# Patient Record
Sex: Male | Born: 1945
Health system: Southern US, Community
[De-identification: ages and names within clinical notes are randomized; demographics above are authoritative.]

## PROBLEM LIST (undated history)

## (undated) DIAGNOSIS — C50919 Malignant neoplasm of unspecified site of unspecified female breast: Secondary | ICD-10-CM

## (undated) DIAGNOSIS — Z803 Family history of malignant neoplasm of breast: Secondary | ICD-10-CM

## (undated) DIAGNOSIS — Z923 Personal history of irradiation: Secondary | ICD-10-CM

## (undated) HISTORY — DX: Malignant neoplasm of unspecified site of unspecified female breast: C50.919

## (undated) HISTORY — DX: Family history of malignant neoplasm of breast: Z80.3

## (undated) HISTORY — PX: ADENOIDECTOMY: SUR15

## (undated) HISTORY — PX: MASTECTOMY: SHX3

---
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
## 2011-02-22 ENCOUNTER — Telehealth: Payer: Self-pay

## 2011-02-24 NOTE — Telephone Encounter (Signed)
Pt came in and scheduled appt

## 2011-03-28 ENCOUNTER — Other Ambulatory Visit: Payer: Self-pay | Admitting: Internal Medicine

## 2011-03-28 ENCOUNTER — Encounter: Payer: Self-pay | Admitting: Internal Medicine

## 2011-03-28 ENCOUNTER — Ambulatory Visit (INDEPENDENT_AMBULATORY_CARE_PROVIDER_SITE_OTHER): Payer: BC Managed Care – PPO | Admitting: Internal Medicine

## 2011-03-28 ENCOUNTER — Other Ambulatory Visit (INDEPENDENT_AMBULATORY_CARE_PROVIDER_SITE_OTHER): Payer: BC Managed Care – PPO

## 2011-03-28 VITALS — BP 122/80 | HR 84 | Temp 98.4°F | Resp 16 | Ht 66.0 in | Wt 170.0 lb

## 2011-03-28 DIAGNOSIS — Z0000 Encounter for general adult medical examination without abnormal findings: Secondary | ICD-10-CM | POA: Insufficient documentation

## 2011-03-28 LAB — URINALYSIS
Hgb urine dipstick: NEGATIVE
Ketones, ur: NEGATIVE
Specific Gravity, Urine: >=1.03 (ref 1.000–1.030)
Urine Glucose: NEGATIVE
Urobilinogen, UA: 0.2 (ref 0.0–1.0)

## 2011-03-28 LAB — CBC WITH DIFFERENTIAL/PLATELET
Basophils Relative: 0.2 % (ref 0.0–3.0)
Eosinophils Absolute: 0.1 10*3/uL (ref 0.0–0.7)
Hemoglobin: 15.6 g/dL (ref 13.0–17.0)
MCHC: 33.8 g/dL (ref 30.0–36.0)
MCV: 83 fl (ref 78.0–100.0)
Monocytes Absolute: 0.8 10*3/uL (ref 0.1–1.0)
Neutro Abs: 5.1 10*3/uL (ref 1.4–7.7)
RBC: 5.56 Mil/uL (ref 4.22–5.81)

## 2011-03-28 LAB — HEPATIC FUNCTION PANEL
AST: 21 U/L (ref 0–37)
Albumin: 3.9 g/dL (ref 3.5–5.2)
Alkaline Phosphatase: 70 U/L (ref 39–117)
Total Protein: 7.1 g/dL (ref 6.0–8.3)

## 2011-03-28 LAB — LIPID PANEL: Cholesterol: 235 mg/dL — ABNORMAL HIGH (ref 0–200)

## 2011-03-28 LAB — BASIC METABOLIC PANEL
BUN: 17 mg/dL (ref 6–23)
Creatinine, Ser: 0.7 mg/dL (ref 0.4–1.5)
GFR: 118.83 mL/min (ref >60.00)

## 2011-03-28 NOTE — Progress Notes (Signed)
  Subjective:    Patient ID: Danny Gutierrez, male    DOB: 26-Jun-1945, 65 y.o.   MRN: [id number]  HPI New pt The patient is here for a wellness exam. The patient has been doing well overall without major physical or psychological issues going on lately. C/o a spot on scalp  Review of Systems  Constitutional: Negative for activity change, appetite change, fatigue and unexpected weight change.  HENT: Negative for nosebleeds, congestion, sore throat, sneezing, trouble swallowing and neck pain.   Eyes: Negative for itching and visual disturbance.  Respiratory: Negative for cough.   Cardiovascular: Negative for chest pain, palpitations and leg swelling.  Gastrointestinal: Negative for nausea, diarrhea, blood in stool and abdominal distention.  Genitourinary: Negative for frequency and hematuria.  Musculoskeletal: Negative for back pain, joint swelling and gait problem.  Skin: Negative for rash.  Neurological: Negative for dizziness, tremors, speech difficulty and weakness.  Psychiatric/Behavioral: Negative for sleep disturbance, dysphoric mood and agitation. The patient is not nervous/anxious.        Objective:   Physical Exam  Constitutional: He is oriented to person, place, and time. He appears well-developed.       Obese  HENT:  Mouth/Throat: Oropharynx is clear and moist.  Eyes: Conjunctivae are normal. Pupils are equal, round, and reactive to light.  Neck: Normal range of motion. No JVD present. No thyromegaly present.  Cardiovascular: Normal rate, regular rhythm, normal heart sounds and intact distal pulses.  Exam reveals no gallop and no friction rub.   No murmur heard. Pulmonary/Chest: Effort normal and breath sounds normal. No respiratory distress. He has no wheezes. He has no rales. He exhibits no tenderness.  Abdominal: Soft. Bowel sounds are normal. He exhibits no distension and no mass. There is no tenderness. There is no rebound and no guarding.  Musculoskeletal:  Normal range of motion. He exhibits no edema and no tenderness.  Lymphadenopathy:    He has no cervical adenopathy.  Neurological: He is alert and oriented to person, place, and time. He has normal reflexes. No cranial nerve deficit. He exhibits normal muscle tone. Coordination normal.  Skin: Skin is warm and dry. No rash noted.  Psychiatric: He has a normal mood and affect. His behavior is normal. Judgment and thought content normal.          Assessment & Plan:

## 2011-03-29 LAB — LDL CHOLESTEROL, DIRECT: Direct LDL: 160.8 mg/dL

## 2011-03-30 ENCOUNTER — Encounter: Payer: Self-pay | Admitting: Internal Medicine

## 2011-03-30 ENCOUNTER — Telehealth: Payer: Self-pay | Admitting: Internal Medicine

## 2011-03-30 NOTE — Assessment & Plan Note (Signed)
We discussed age appropriate health related issues, including available/recomended screening tests and vaccinations. We discussed a need for adhering to healthy diet and exercise. Labs/EKG were reviewed/ordered. All questions were answered.  Declined all shots  

## 2011-03-30 NOTE — Telephone Encounter (Signed)
Stacey, please, inform patient that all labs are normal   Please, mail the labs to the patient.    Thx  

## 2011-03-31 NOTE — Telephone Encounter (Signed)
Left detailed mess informing pt of below. Copies mailed.  

## 2011-06-08 ENCOUNTER — Encounter: Payer: Self-pay | Admitting: Gastroenterology

## 2012-01-24 ENCOUNTER — Ambulatory Visit (INDEPENDENT_AMBULATORY_CARE_PROVIDER_SITE_OTHER): Payer: BC Managed Care – PPO | Admitting: Internal Medicine

## 2012-01-24 ENCOUNTER — Encounter: Payer: Self-pay | Admitting: Internal Medicine

## 2012-01-24 ENCOUNTER — Other Ambulatory Visit (INDEPENDENT_AMBULATORY_CARE_PROVIDER_SITE_OTHER): Payer: BC Managed Care – PPO

## 2012-01-24 VITALS — BP 132/80 | HR 80 | Temp 98.7°F | Resp 16 | Wt 163.0 lb

## 2012-01-24 DIAGNOSIS — L723 Sebaceous cyst: Secondary | ICD-10-CM

## 2012-01-24 DIAGNOSIS — Z0000 Encounter for general adult medical examination without abnormal findings: Secondary | ICD-10-CM

## 2012-01-24 LAB — CBC WITH DIFFERENTIAL/PLATELET
Basophils Absolute: 0 10*3/uL (ref 0.0–0.1)
Basophils Relative: 0.3 % (ref 0.0–3.0)
Eosinophils Absolute: 0.1 10*3/uL (ref 0.0–0.7)
Lymphocytes Relative: 21.7 % (ref 12.0–46.0)
MCHC: 33.4 g/dL (ref 30.0–36.0)
MCV: 83.9 fl (ref 78.0–100.0)
Monocytes Absolute: 0.5 10*3/uL (ref 0.1–1.0)
Neutrophils Relative %: 69.8 % (ref 43.0–77.0)
RDW: 14.9 % — ABNORMAL HIGH (ref 11.5–14.6)

## 2012-01-24 LAB — LIPID PANEL
Cholesterol: 228 mg/dL — ABNORMAL HIGH (ref 0–200)
HDL: 52.8 mg/dL (ref >39.00)
Triglycerides: 48 mg/dL (ref 0.0–149.0)
VLDL: 9.6 mg/dL (ref 0.0–40.0)

## 2012-01-24 LAB — LDL CHOLESTEROL, DIRECT: Direct LDL: 157.9 mg/dL

## 2012-01-24 LAB — BASIC METABOLIC PANEL
CO2: 25 mEq/L (ref 19–32)
Calcium: 9.3 mg/dL (ref 8.4–10.5)
Chloride: 102 mEq/L (ref 96–112)
Potassium: 4.5 mEq/L (ref 3.5–5.1)
Sodium: 137 mEq/L (ref 135–145)

## 2012-01-24 LAB — URINALYSIS, ROUTINE W REFLEX MICROSCOPIC
Bilirubin Urine: NEGATIVE
Nitrite: NEGATIVE
Total Protein, Urine: NEGATIVE
pH: 5 (ref 5.0–8.0)

## 2012-01-24 LAB — PSA: PSA: 2.04 ng/mL (ref 0.10–4.00)

## 2012-01-24 LAB — HEPATIC FUNCTION PANEL
AST: 19 U/L (ref 0–37)
Alkaline Phosphatase: 57 U/L (ref 39–117)
Bilirubin, Direct: 0.1 mg/dL (ref 0.0–0.3)
Total Bilirubin: 0.8 mg/dL (ref 0.3–1.2)

## 2012-01-24 NOTE — Assessment & Plan Note (Signed)
We discussed age appropriate health related issues, including available/recomended screening tests and vaccinations. We discussed a need for adhering to healthy diet and exercise. Labs/EKG were reviewed/ordered. All questions were answered.  Declined all shots Colonoscopy was adviced

## 2012-01-24 NOTE — Assessment & Plan Note (Signed)
2013 scalp - confirmed by [place] He declined an option of removal

## 2012-01-24 NOTE — Progress Notes (Signed)
   Subjective:    Patient ID: Danny Gutierrez, male    DOB: 10/01/1945, 66 y.o.   MRN: [id number]  HPI  The patient is here for a wellness exam. The patient has been doing well overall without major physical or psychological issues going on lately. C/o a growth on scalp  Review of Systems  Constitutional: Negative for activity change, appetite change, fatigue and unexpected weight change.  HENT: Negative for nosebleeds, congestion, sore throat, sneezing, trouble swallowing and neck pain.   Eyes: Negative for itching and visual disturbance.  Respiratory: Negative for cough.   Cardiovascular: Negative for chest pain, palpitations and leg swelling.  Gastrointestinal: Negative for nausea, diarrhea, blood in stool and abdominal distention.  Genitourinary: Negative for frequency and hematuria.  Musculoskeletal: Negative for back pain, joint swelling and gait problem.  Skin: Negative for rash.  Neurological: Negative for dizziness, tremors, speech difficulty and weakness.  Psychiatric/Behavioral: Negative for disturbed wake/sleep cycle, dysphoric mood and agitation. The patient is not nervous/anxious.        Objective:   Physical Exam  Constitutional: He is oriented to person, place, and time. He appears well-developed.       Obese  HENT:  Mouth/Throat: Oropharynx is clear and moist.  Eyes: Conjunctivae are normal. Pupils are equal, round, and reactive to light.  Neck: Normal range of motion. No JVD present. No thyromegaly present.  Cardiovascular: Normal rate, regular rhythm, normal heart sounds and intact distal pulses.  Exam reveals no gallop and no friction rub.   No murmur heard. Pulmonary/Chest: Effort normal and breath sounds normal. No respiratory distress. He has no wheezes. He has no rales. He exhibits no tenderness.  Abdominal: Soft. Bowel sounds are normal. He exhibits no distension and no mass. There is no tenderness. There is no rebound and no guarding.    Musculoskeletal: Normal range of motion. He exhibits no edema and no tenderness.  Lymphadenopathy:    He has no cervical adenopathy.  Neurological: He is alert and oriented to person, place, and time. He has normal reflexes. No cranial nerve deficit. He exhibits normal muscle tone. Coordination normal.  Skin: Skin is warm and dry. No rash noted.  Psychiatric: He has a normal mood and affect. His behavior is normal. Judgment and thought content normal.  An oval sq elastic growth on R scalp        Assessment & Plan:

## 2012-02-06 ENCOUNTER — Telehealth: Payer: Self-pay | Admitting: Internal Medicine

## 2012-02-06 NOTE — Telephone Encounter (Signed)
The pt is hoping to schedule a ekg to be done this week.  He stated he forgot to mention wanting to have one done during his cpe.  I added him to the schedule for Wednesday am, however, I mentioned to him that I would have to make sure it was okay with Dr.Plotnikov.  He states he is not having any pain, or chest discomfort, but is "concerned about his heart due to his age".  Please let me know if this is okay, or if you want his appointment taken off the schedule.  Thanks!

## 2012-02-06 NOTE — Telephone Encounter (Signed)
Ok EKG Thx

## 2012-02-08 ENCOUNTER — Ambulatory Visit (INDEPENDENT_AMBULATORY_CARE_PROVIDER_SITE_OTHER): Payer: BC Managed Care – PPO | Admitting: Internal Medicine

## 2012-02-08 ENCOUNTER — Encounter: Payer: Self-pay | Admitting: Internal Medicine

## 2012-02-08 VITALS — BP 108/68 | HR 76 | Temp 98.6°F | Resp 16 | Wt 163.0 lb

## 2012-02-08 DIAGNOSIS — Z136 Encounter for screening for cardiovascular disorders: Secondary | ICD-10-CM

## 2012-02-08 DIAGNOSIS — Z0000 Encounter for general adult medical examination without abnormal findings: Secondary | ICD-10-CM

## 2012-02-08 NOTE — Progress Notes (Signed)
  Subjective:    Patient ID: Danny Gutierrez, male    DOB: [birth year], 66 y.o.   MRN: [id number]  HPI  He is here for EKG only  Review of Systems     Objective:   Physical Exam  NE      Assessment & Plan:

## 2012-02-08 NOTE — Assessment & Plan Note (Signed)
EKG done today per pt's request

## 2013-01-08 ENCOUNTER — Ambulatory Visit (INDEPENDENT_AMBULATORY_CARE_PROVIDER_SITE_OTHER): Payer: PRIVATE HEALTH INSURANCE

## 2013-01-08 ENCOUNTER — Ambulatory Visit (INDEPENDENT_AMBULATORY_CARE_PROVIDER_SITE_OTHER): Payer: PRIVATE HEALTH INSURANCE | Admitting: Internal Medicine

## 2013-01-08 ENCOUNTER — Encounter: Payer: Self-pay | Admitting: Internal Medicine

## 2013-01-08 VITALS — BP 122/88 | HR 76 | Temp 98.2°F | Resp 16 | Ht 64.0 in | Wt 156.0 lb

## 2013-01-08 DIAGNOSIS — N320 Bladder-neck obstruction: Secondary | ICD-10-CM

## 2013-01-08 DIAGNOSIS — E785 Hyperlipidemia, unspecified: Secondary | ICD-10-CM

## 2013-01-08 DIAGNOSIS — M6740 Ganglion, unspecified site: Secondary | ICD-10-CM | POA: Insufficient documentation

## 2013-01-08 DIAGNOSIS — Z0000 Encounter for general adult medical examination without abnormal findings: Secondary | ICD-10-CM

## 2013-01-08 DIAGNOSIS — L723 Sebaceous cyst: Secondary | ICD-10-CM

## 2013-01-08 LAB — URINALYSIS
Leukocytes, UA: NEGATIVE
Specific Gravity, Urine: >=1.03 (ref 1.000–1.030)
Urobilinogen, UA: 0.2 (ref 0.0–1.0)

## 2013-01-08 LAB — CBC WITH DIFFERENTIAL/PLATELET
Basophils Relative: 0.3 % (ref 0.0–3.0)
Eosinophils Absolute: 0.1 10*3/uL (ref 0.0–0.7)
HCT: 47.2 % (ref 39.0–52.0)
Lymphs Abs: 2.3 10*3/uL (ref 0.7–4.0)
MCHC: 33.6 g/dL (ref 30.0–36.0)
MCV: 83.1 fl (ref 78.0–100.0)
Monocytes Absolute: 0.7 10*3/uL (ref 0.1–1.0)
Neutrophils Relative %: 68.4 % (ref 43.0–77.0)
Platelets: 203 10*3/uL (ref 150.0–400.0)
RBC: 5.69 Mil/uL (ref 4.22–5.81)

## 2013-01-08 NOTE — Assessment & Plan Note (Signed)
Declined removal

## 2013-01-08 NOTE — Assessment & Plan Note (Signed)
We discussed age appropriate health related issues, including available/recomended screening tests and vaccinations. We discussed a need for adhering to healthy diet and exercise. Labs/EKG were reviewed/ordered. All questions were answered.  Declined all shots  

## 2013-01-08 NOTE — Progress Notes (Signed)
Patient ID: Danny Gutierrez, male   DOB: Jul 27, 1945, 67 y.o.   MRN: [id number]   Subjective:    Patient ID: Danny Gutierrez, male    DOB: 11-07-1945, 67 y.o.   MRN: [id number]  HPI  The patient is here for a wellness exam. The patient has been doing well overall without major physical or psychological issues going on lately. C/o a growth on scalp  Review of Systems  Constitutional: Negative for activity change, appetite change, fatigue and unexpected weight change.  HENT: Negative for nosebleeds, congestion, sore throat, sneezing, trouble swallowing and neck pain.   Eyes: Negative for itching and visual disturbance.  Respiratory: Negative for cough.   Cardiovascular: Negative for chest pain, palpitations and leg swelling.  Gastrointestinal: Negative for nausea, diarrhea, blood in stool and abdominal distention.  Genitourinary: Negative for frequency and hematuria.  Musculoskeletal: Negative for back pain, joint swelling and gait problem.  Skin: Negative for rash.  Neurological: Negative for dizziness, tremors, speech difficulty and weakness.  Psychiatric/Behavioral: Negative for sleep disturbance, dysphoric mood and agitation. The patient is not nervous/anxious.        Objective:   Physical Exam  Constitutional: He is oriented to person, place, and time. He appears well-developed.  Obese  HENT:  Mouth/Throat: Oropharynx is clear and moist.  Eyes: Conjunctivae are normal. Pupils are equal, round, and reactive to light.  Neck: Normal range of motion. No JVD present. No thyromegaly present.  Cardiovascular: Normal rate, regular rhythm, normal heart sounds and intact distal pulses.  Exam reveals no gallop and no friction rub.   No murmur heard. Pulmonary/Chest: Effort normal and breath sounds normal. No respiratory distress. He has no wheezes. He has no rales. He exhibits no tenderness.  Abdominal: Soft. Bowel sounds are normal. He exhibits no distension and no mass. There  is no tenderness. There is no rebound and no guarding.  Musculoskeletal: Normal range of motion. He exhibits no edema and no tenderness.  Lymphadenopathy:    He has no cervical adenopathy.  Neurological: He is alert and oriented to person, place, and time. He has normal reflexes. No cranial nerve deficit. He exhibits normal muscle tone. Coordination normal.  Skin: Skin is warm and dry. No rash noted.  Psychiatric: He has a normal mood and affect. His behavior is normal. Judgment and thought content normal.  An oval sq elastic growth on R scalp Two 5 mm round superficial NT mobile elastic cysts on L inner lower jaw gum       Assessment & Plan:

## 2013-01-08 NOTE — Assessment & Plan Note (Addendum)
L lower gum small mucous cyst x2 F/u /Dr Stormy Fabian

## 2013-01-09 ENCOUNTER — Encounter: Payer: Self-pay | Admitting: Internal Medicine

## 2013-01-09 LAB — BASIC METABOLIC PANEL
BUN: 19 mg/dL (ref 6–23)
Calcium: 9.9 mg/dL (ref 8.4–10.5)
GFR: 110.92 mL/min (ref >60.00)
Potassium: 4.6 mEq/L (ref 3.5–5.1)

## 2013-01-09 LAB — PSA: PSA: 1.99 ng/mL (ref 0.10–4.00)

## 2013-01-09 LAB — LDL CHOLESTEROL, DIRECT: Direct LDL: 175.3 mg/dL

## 2013-01-09 LAB — LIPID PANEL
Cholesterol: 238 mg/dL — ABNORMAL HIGH (ref 0–200)
VLDL: 13.4 mg/dL (ref 0.0–40.0)

## 2013-01-09 LAB — TSH: TSH: 2.26 u[IU]/mL (ref 0.35–5.50)

## 2013-01-09 LAB — HEPATIC FUNCTION PANEL
AST: 19 U/L (ref 0–37)
Total Bilirubin: 0.8 mg/dL (ref 0.3–1.2)

## 2013-01-23 ENCOUNTER — Encounter: Payer: BC Managed Care – PPO | Admitting: Internal Medicine

## 2013-01-23 ENCOUNTER — Other Ambulatory Visit: Payer: Self-pay

## 2013-01-24 ENCOUNTER — Other Ambulatory Visit (INDEPENDENT_AMBULATORY_CARE_PROVIDER_SITE_OTHER): Payer: PRIVATE HEALTH INSURANCE

## 2013-01-24 ENCOUNTER — Other Ambulatory Visit: Payer: Self-pay | Admitting: *Deleted

## 2013-01-24 DIAGNOSIS — M791 Myalgia, unspecified site: Secondary | ICD-10-CM

## 2013-01-24 DIAGNOSIS — IMO0001 Reserved for inherently not codable concepts without codable children: Secondary | ICD-10-CM

## 2013-01-24 LAB — SEDIMENTATION RATE: Sed Rate: 12 mm/hr (ref 0–22)

## 2013-04-25 ENCOUNTER — Other Ambulatory Visit: Payer: Self-pay

## 2014-01-10 ENCOUNTER — Encounter: Payer: Self-pay | Admitting: Internal Medicine

## 2014-01-10 ENCOUNTER — Ambulatory Visit (INDEPENDENT_AMBULATORY_CARE_PROVIDER_SITE_OTHER): Payer: Medicare Other | Admitting: Internal Medicine

## 2014-01-10 ENCOUNTER — Other Ambulatory Visit (INDEPENDENT_AMBULATORY_CARE_PROVIDER_SITE_OTHER): Payer: Medicare Other

## 2014-01-10 VITALS — BP 140/80 | HR 72 | Temp 98.6°F | Resp 16 | Ht 66.0 in | Wt 176.0 lb

## 2014-01-10 DIAGNOSIS — E785 Hyperlipidemia, unspecified: Secondary | ICD-10-CM

## 2014-01-10 DIAGNOSIS — Z0000 Encounter for general adult medical examination without abnormal findings: Secondary | ICD-10-CM

## 2014-01-10 DIAGNOSIS — M6740 Ganglion, unspecified site: Secondary | ICD-10-CM

## 2014-01-10 DIAGNOSIS — L723 Sebaceous cyst: Secondary | ICD-10-CM

## 2014-01-10 DIAGNOSIS — N320 Bladder-neck obstruction: Secondary | ICD-10-CM

## 2014-01-10 DIAGNOSIS — Z136 Encounter for screening for cardiovascular disorders: Secondary | ICD-10-CM

## 2014-01-10 LAB — CBC WITH DIFFERENTIAL/PLATELET
BASOS PCT: 0.4 % (ref 0.0–3.0)
Basophils Absolute: 0 10*3/uL (ref 0.0–0.1)
EOS ABS: 0.1 10*3/uL (ref 0.0–0.7)
EOS PCT: 1.4 % (ref 0.0–5.0)
HCT: 46.2 % (ref 39.0–52.0)
HEMOGLOBIN: 15.6 g/dL (ref 13.0–17.0)
Lymphocytes Relative: 24 % (ref 12.0–46.0)
Lymphs Abs: 2 10*3/uL (ref 0.7–4.0)
MCHC: 33.9 g/dL (ref 30.0–36.0)
MCV: 83.2 fl (ref 78.0–100.0)
MONOS PCT: 6.3 % (ref 3.0–12.0)
Monocytes Absolute: 0.5 10*3/uL (ref 0.1–1.0)
NEUTROS ABS: 5.6 10*3/uL (ref 1.4–7.7)
NEUTROS PCT: 67.9 % (ref 43.0–77.0)
Platelets: 214 10*3/uL (ref 150.0–400.0)
RBC: 5.55 Mil/uL (ref 4.22–5.81)
RDW: 14.6 % (ref 11.5–15.5)
WBC: 8.3 10*3/uL (ref 4.0–10.5)

## 2014-01-10 LAB — HEPATIC FUNCTION PANEL
ALBUMIN: 4.2 g/dL (ref 3.5–5.2)
ALT: 24 U/L (ref 0–53)
AST: 21 U/L (ref 0–37)
Alkaline Phosphatase: 70 U/L (ref 39–117)
Bilirubin, Direct: 0.2 mg/dL (ref 0.0–0.3)
TOTAL PROTEIN: 7.7 g/dL (ref 6.0–8.3)
Total Bilirubin: 1.1 mg/dL (ref 0.2–1.2)

## 2014-01-10 LAB — LIPID PANEL
Cholesterol: 243 mg/dL — ABNORMAL HIGH (ref 0–200)
HDL: 38.1 mg/dL — ABNORMAL LOW (ref >39.00)
LDL Cholesterol: 179 mg/dL — ABNORMAL HIGH (ref 0–99)
NONHDL: 204.9
Total CHOL/HDL Ratio: 6
Triglycerides: 131 mg/dL (ref 0.0–149.0)
VLDL: 26.2 mg/dL (ref 0.0–40.0)

## 2014-01-10 LAB — BASIC METABOLIC PANEL
BUN: 18 mg/dL (ref 6–23)
CALCIUM: 9.4 mg/dL (ref 8.4–10.5)
CO2: 25 meq/L (ref 19–32)
CREATININE: 0.7 mg/dL (ref 0.4–1.5)
Chloride: 106 mEq/L (ref 96–112)
GFR: 117.8 mL/min (ref >60.00)
Glucose, Bld: 102 mg/dL — ABNORMAL HIGH (ref 70–99)
Potassium: 3.7 mEq/L (ref 3.5–5.1)
SODIUM: 138 meq/L (ref 135–145)

## 2014-01-10 LAB — PSA: PSA: 2.62 ng/mL (ref 0.10–4.00)

## 2014-01-10 LAB — SEDIMENTATION RATE: Sed Rate: 16 mm/hr (ref 0–22)

## 2014-01-10 LAB — TSH: TSH: 2.45 u[IU]/mL (ref 0.35–4.50)

## 2014-01-10 NOTE — Assessment & Plan Note (Signed)
2013 scalp - confirmed by [place] - no change

## 2014-01-10 NOTE — Progress Notes (Signed)
Pre visit review using our clinic review tool, if applicable. No additional management support is needed unless otherwise documented below in the visit note. 

## 2014-01-10 NOTE — Assessment & Plan Note (Signed)
No change 

## 2014-01-10 NOTE — Progress Notes (Signed)
   Subjective:     HPI  The patient is here for a wellness exam. The patient has been doing well overall without major physical or psychological issues going on lately.  C/o a growth on scalp and in the mouth  BP Readings from Last 3 Encounters:  01/10/14 140/80  01/08/13 122/88  02/08/12 108/68   Wt Readings from Last 3 Encounters:  01/10/14 176 lb (79.833 kg)  01/08/13 156 lb (70.761 kg)  02/08/12 163 lb (73.936 kg)    Review of Systems  Constitutional: Negative for activity change, appetite change, fatigue and unexpected weight change.  HENT: Negative for congestion, nosebleeds, sneezing, sore throat and trouble swallowing.   Eyes: Negative for itching and visual disturbance.  Respiratory: Negative for cough.   Cardiovascular: Negative for chest pain, palpitations and leg swelling.  Gastrointestinal: Negative for nausea, diarrhea, blood in stool and abdominal distention.  Genitourinary: Negative for frequency and hematuria.  Musculoskeletal: Negative for back pain, gait problem, joint swelling and neck pain.  Skin: Negative for rash.  Neurological: Negative for dizziness, tremors, speech difficulty and weakness.  Psychiatric/Behavioral: Negative for sleep disturbance, dysphoric mood and agitation. The patient is not nervous/anxious.        Objective:   Physical Exam  Constitutional: He is oriented to person, place, and time. He appears well-developed.  Obese  HENT:  Mouth/Throat: Oropharynx is clear and moist.  Eyes: Conjunctivae are normal. Pupils are equal, round, and reactive to light.  Neck: Normal range of motion. No JVD present. No thyromegaly present.  Cardiovascular: Normal rate, regular rhythm, normal heart sounds and intact distal pulses.  Exam reveals no gallop and no friction rub.   No murmur heard. Pulmonary/Chest: Effort normal and breath sounds normal. No respiratory distress. He has no wheezes. He has no rales. He exhibits no tenderness.  Abdominal:  Soft. Bowel sounds are normal. He exhibits no distension and no mass. There is no tenderness. There is no rebound and no guarding.  Musculoskeletal: Normal range of motion. He exhibits no edema and no tenderness.  Lymphadenopathy:    He has no cervical adenopathy.  Neurological: He is alert and oriented to person, place, and time. He has normal reflexes. No cranial nerve deficit. He exhibits normal muscle tone. Coordination normal.  Skin: Skin is warm and dry. No rash noted.  Psychiatric: He has a normal mood and affect. His behavior is normal. Judgment and thought content normal.  An oval sq elastic growth on R scalp - no change Two 5 mm round superficial NT mobile elastic cysts on L inner lower jaw gum - no change  Lab Results  Component Value Date   WBC 10.0 01/08/2013   HGB 15.9 01/08/2013   HCT 47.2 01/08/2013   PLT 203.0 01/08/2013   GLUCOSE 93 01/08/2013   CHOL 238* 01/08/2013   TRIG 67.0 01/08/2013   HDL 52.60 01/08/2013   LDLDIRECT 175.3 01/08/2013   ALT 24 01/08/2013   AST 19 01/08/2013   NA 143 01/08/2013   K 4.6 01/08/2013   CL 106 01/08/2013   CREATININE 0.8 01/08/2013   BUN 19 01/08/2013   CO2 27 01/08/2013   TSH 2.26 01/08/2013   PSA 1.99 01/08/2013         Assessment & Plan:

## 2014-01-11 NOTE — Patient Instructions (Signed)

## 2014-01-11 NOTE — Assessment & Plan Note (Signed)
Here for medicare wellness/physical  Diet: heart healthy  Physical activity: not sedentary  Depression/mood screen: negative  Hearing: intact to whispered voice  Visual acuity: grossly normal, performs annual eye exam  ADLs: capable  Fall risk: none  Home safety: good  Cognitive evaluation: intact to orientation, naming, recall and repetition  EOL planning: adv directives, full code/ I agree  I have personally reviewed and have noted  1. The patient's medical and social history  2. Their use of alcohol, tobacco or illicit drugs  3. Their current medications and supplements  4. The patient's functional ability including ADL's, fall risks, home safety risks and hearing or visual impairment.  5. Diet and physical activities  6. Evidence for depression or mood disorders    Today patient counseled on age appropriate routine health concerns for screening and prevention, each reviewed and up to date or declined. Immunizations reviewed and up to date or declined. Labs ordered and reviewed. Risk factors for depression reviewed and negative. Hearing function and visual acuity are intact. ADLs screened and addressed as needed. Functional ability and level of safety reviewed and appropriate. Education, counseling and referrals performed based on assessed risks today. Patient provided with a copy of personalized plan for preventive services.   Cologuard info Declined all shots

## 2014-01-13 LAB — URINALYSIS
BILIRUBIN URINE: NEGATIVE
HGB URINE DIPSTICK: NEGATIVE
Ketones, ur: NEGATIVE
LEUKOCYTES UA: NEGATIVE
NITRITE: NEGATIVE
Specific Gravity, Urine: 1.02 (ref 1.000–1.030)
Total Protein, Urine: NEGATIVE
Urine Glucose: NEGATIVE
Urobilinogen, UA: 0.2 (ref 0.0–1.0)
pH: 6 (ref 5.0–8.0)

## 2015-01-12 ENCOUNTER — Ambulatory Visit (INDEPENDENT_AMBULATORY_CARE_PROVIDER_SITE_OTHER): Payer: Medicare HMO | Admitting: Internal Medicine

## 2015-01-12 ENCOUNTER — Other Ambulatory Visit (INDEPENDENT_AMBULATORY_CARE_PROVIDER_SITE_OTHER): Payer: Medicare HMO

## 2015-01-12 ENCOUNTER — Encounter: Payer: Self-pay | Admitting: Internal Medicine

## 2015-01-12 VITALS — BP 140/90 | HR 77 | Resp 12 | Ht 66.0 in | Wt 173.0 lb

## 2015-01-12 DIAGNOSIS — Z0000 Encounter for general adult medical examination without abnormal findings: Secondary | ICD-10-CM | POA: Diagnosis not present

## 2015-01-12 DIAGNOSIS — N320 Bladder-neck obstruction: Secondary | ICD-10-CM | POA: Diagnosis not present

## 2015-01-12 DIAGNOSIS — E785 Hyperlipidemia, unspecified: Secondary | ICD-10-CM

## 2015-01-12 DIAGNOSIS — R002 Palpitations: Secondary | ICD-10-CM

## 2015-01-12 DIAGNOSIS — N402 Nodular prostate without lower urinary tract symptoms: Secondary | ICD-10-CM

## 2015-01-12 DIAGNOSIS — L723 Sebaceous cyst: Secondary | ICD-10-CM

## 2015-01-12 LAB — URINALYSIS
Bilirubin Urine: NEGATIVE
Hgb urine dipstick: NEGATIVE
Ketones, ur: NEGATIVE
Leukocytes, UA: NEGATIVE
Nitrite: NEGATIVE
PH: 5.5 (ref 5.0–8.0)
Specific Gravity, Urine: >=1.03 — AB (ref 1.000–1.030)
Total Protein, Urine: NEGATIVE
URINE GLUCOSE: NEGATIVE
Urobilinogen, UA: 0.2 (ref 0.0–1.0)

## 2015-01-12 LAB — CBC WITH DIFFERENTIAL/PLATELET
BASOS ABS: 0 10*3/uL (ref 0.0–0.1)
Basophils Relative: 0.4 % (ref 0.0–3.0)
Eosinophils Absolute: 0.2 10*3/uL (ref 0.0–0.7)
Eosinophils Relative: 2.1 % (ref 0.0–5.0)
HCT: 47 % (ref 39.0–52.0)
HEMOGLOBIN: 15.7 g/dL (ref 13.0–17.0)
Lymphocytes Relative: 24.4 % (ref 12.0–46.0)
Lymphs Abs: 2.1 10*3/uL (ref 0.7–4.0)
MCHC: 33.5 g/dL (ref 30.0–36.0)
MCV: 83.1 fl (ref 78.0–100.0)
MONO ABS: 0.5 10*3/uL (ref 0.1–1.0)
Monocytes Relative: 6.1 % (ref 3.0–12.0)
NEUTROS ABS: 5.8 10*3/uL (ref 1.4–7.7)
Neutrophils Relative %: 67 % (ref 43.0–77.0)
Platelets: 213 10*3/uL (ref 150.0–400.0)
RBC: 5.65 Mil/uL (ref 4.22–5.81)
RDW: 14.8 % (ref 11.5–15.5)
WBC: 8.6 10*3/uL (ref 4.0–10.5)

## 2015-01-12 LAB — HEPATIC FUNCTION PANEL
ALK PHOS: 71 U/L (ref 39–117)
ALT: 18 U/L (ref 0–53)
AST: 14 U/L (ref 0–37)
Albumin: 4.2 g/dL (ref 3.5–5.2)
Bilirubin, Direct: 0.2 mg/dL (ref 0.0–0.3)
TOTAL PROTEIN: 6.6 g/dL (ref 6.0–8.3)
Total Bilirubin: 0.8 mg/dL (ref 0.2–1.2)

## 2015-01-12 LAB — BASIC METABOLIC PANEL
BUN: 15 mg/dL (ref 6–23)
CO2: 25 meq/L (ref 19–32)
CREATININE: 0.77 mg/dL (ref 0.40–1.50)
Calcium: 9.3 mg/dL (ref 8.4–10.5)
Chloride: 106 mEq/L (ref 96–112)
GFR: 106.95 mL/min (ref >60.00)
Glucose, Bld: 96 mg/dL (ref 70–99)
Potassium: 3.8 mEq/L (ref 3.5–5.1)
Sodium: 140 mEq/L (ref 135–145)

## 2015-01-12 LAB — PSA: PSA: 2.99 ng/mL (ref 0.10–4.00)

## 2015-01-12 LAB — LIPID PANEL
CHOL/HDL RATIO: 6
Cholesterol: 192 mg/dL (ref 0–200)
HDL: 32.5 mg/dL — ABNORMAL LOW (ref >39.00)
LDL Cholesterol: 122 mg/dL — ABNORMAL HIGH (ref 0–99)
NonHDL: 159.5
Triglycerides: 186 mg/dL — ABNORMAL HIGH (ref 0.0–149.0)
VLDL: 37.2 mg/dL (ref 0.0–40.0)

## 2015-01-12 LAB — SEDIMENTATION RATE: Sed Rate: 17 mm/hr (ref 0–22)

## 2015-01-12 LAB — TSH: TSH: 2.18 u[IU]/mL (ref 0.35–4.50)

## 2015-01-12 NOTE — Assessment & Plan Note (Signed)
EKG [] Labs []

## 2015-01-12 NOTE — Assessment & Plan Note (Signed)
POC [place]: stable cyst

## 2015-01-12 NOTE — Assessment & Plan Note (Signed)
Prostate 1+ with a R lobe 3-4 mm nodule Urol ref PSA

## 2015-01-12 NOTE — Patient Instructions (Addendum)
Zostavax vaccine   Preventive Care for Adults A healthy lifestyle and preventive care can promote health and wellness. Preventive health guidelines for men include the following key practices:  A routine yearly physical is a good way to check with your health care provider about your health and preventative screening. It is a chance to share any concerns and updates on your health and to receive a thorough exam.  Visit your dentist for a routine exam and preventative care every 6 months. Brush your teeth twice a day and floss once a day. Good oral hygiene prevents tooth decay and gum disease.  The frequency of eye exams is based on your age, health, family medical history, use of contact lenses, and other factors. Follow your health care provider's recommendations for frequency of eye exams.  Eat a healthy diet. Foods such as vegetables, fruits, whole grains, low-fat dairy products, and lean protein foods contain the nutrients you need without too many calories. Decrease your intake of foods high in solid fats, added sugars, and salt. Eat the right amount of calories for you.Get information about a proper diet from your health care provider, if necessary.  Regular physical exercise is one of the most important things you can do for your health. Most adults should get at least 150 minutes of moderate-intensity exercise (any activity that increases your heart rate and causes you to sweat) each week. In addition, most adults need muscle-strengthening exercises on 2 or more days a week.  Maintain a healthy weight. The body mass index (BMI) is a screening tool to identify possible weight problems. It provides an estimate of body fat based on height and weight. Your health care provider can find your BMI and can help you achieve or maintain a healthy weight.For adults 20 years and older:  A BMI below 18.5 is considered underweight.  A BMI of 18.5 to 24.9 is normal.  A BMI of 25 to 29.9 is  considered overweight.  A BMI of 30 and above is considered obese.  Maintain normal blood lipids and cholesterol levels by exercising and minimizing your intake of saturated fat. Eat a balanced diet with plenty of fruit and vegetables. Blood tests for lipids and cholesterol should begin at age 61 and be repeated every 5 years. If your lipid or cholesterol levels are high, you are over 50, or you are at high risk for heart disease, you may need your cholesterol levels checked more frequently.Ongoing high lipid and cholesterol levels should be treated with medicines if diet and exercise are not working.  If you smoke, find out from your health care provider how to quit. If you do not use tobacco, do not start.  Lung cancer screening is recommended for adults aged 29-80 years who are at high risk for developing lung cancer because of a history of smoking. A yearly low-dose CT scan of the lungs is recommended for people who have at least a 30-pack-year history of smoking and are a current smoker or have quit within the past 15 years. A pack year of smoking is smoking an average of 1 pack of cigarettes a day for 1 year (for example: 1 pack a day for 30 years or 2 packs a day for 15 years). Yearly screening should continue until the smoker has stopped smoking for at least 15 years. Yearly screening should be stopped for people who develop a health problem that would prevent them from having lung cancer treatment.  If you choose to drink alcohol, do  not have more than 2 drinks per day. One drink is considered to be 12 ounces (355 mL) of beer, 5 ounces (148 mL) of wine, or 1.5 ounces (44 mL) of liquor.  Avoid use of street drugs. Do not share needles with anyone. Ask for help if you need support or instructions about stopping the use of drugs.  High blood pressure causes heart disease and increases the risk of stroke. Your blood pressure should be checked at least every 1-2 years. Ongoing high blood pressure  should be treated with medicines, if weight loss and exercise are not effective.  If you are 48-28 years old, ask your health care provider if you should take aspirin to prevent heart disease.  Diabetes screening involves taking a blood sample to check your fasting blood sugar level. This should be done once every 3 years, after age 59, if you are within normal weight and without risk factors for diabetes. Testing should be considered at a younger age or be carried out more frequently if you are overweight and have at least 1 risk factor for diabetes.  Colorectal cancer can be detected and often prevented. Most routine colorectal cancer screening begins at the age of 57 and continues through age 7. However, your health care provider may recommend screening at an earlier age if you have risk factors for colon cancer. On a yearly basis, your health care provider may provide home test kits to check for hidden blood in the stool. Use of a small camera at the end of a tube to directly examine the colon (sigmoidoscopy or colonoscopy) can detect the earliest forms of colorectal cancer. Talk to your health care provider about this at age 39, when routine screening begins. Direct exam of the colon should be repeated every 5-10 years through age 47, unless early forms of precancerous polyps or small growths are found.  People who are at an increased risk for hepatitis B should be screened for this virus. You are considered at high risk for hepatitis B if:  You were born in a country where hepatitis B occurs often. Talk with your health care provider about which countries are considered high risk.  Your parents were born in a high-risk country and you have not received a shot to protect against hepatitis B (hepatitis B vaccine).  You have HIV or AIDS.  You use needles to inject street drugs.  You live with, or have sex with, someone who has hepatitis B.  You are a man who has sex with other men  (MSM).  You get hemodialysis treatment.  You take certain medicines for conditions such as cancer, organ transplantation, and autoimmune conditions.  Hepatitis C blood testing is recommended for all people born from [birth year] through 1965 and any individual with known risks for hepatitis C.  Practice safe sex. Use condoms and avoid high-risk sexual practices to reduce the spread of sexually transmitted infections (STIs). STIs include gonorrhea, chlamydia, syphilis, trichomonas, herpes, HPV, and human immunodeficiency virus (HIV). Herpes, HIV, and HPV are viral illnesses that have no cure. They can result in disability, cancer, and death.  If you are at risk of being infected with HIV, it is recommended that you take a prescription medicine daily to prevent HIV infection. This is called preexposure prophylaxis (PrEP). You are considered at risk if:  You are a man who has sex with other men (MSM) and have other risk factors.  You are a heterosexual man, are sexually active, and are at increased  risk for HIV infection.  You take drugs by injection.  You are sexually active with a partner who has HIV.  Talk with your health care provider about whether you are at high risk of being infected with HIV. If you choose to begin PrEP, you should first be tested for HIV. You should then be tested every 3 months for as long as you are taking PrEP.  A one-time screening for abdominal aortic aneurysm (AAA) and surgical repair of large AAAs by ultrasound are recommended for men ages 52 to 26 years who are current or former smokers.  Healthy men should no longer receive prostate-specific antigen (PSA) blood tests as part of routine cancer screening. Talk with your health care provider about prostate cancer screening.  Testicular cancer screening is not recommended for adult males who have no symptoms. Screening includes self-exam, a health care provider exam, and other screening tests. Consult with your health  care provider about any symptoms you have or any concerns you have about testicular cancer.  Use sunscreen. Apply sunscreen liberally and repeatedly throughout the day. You should seek shade when your shadow is shorter than you. Protect yourself by wearing long sleeves, pants, a wide-brimmed hat, and sunglasses year round, whenever you are outdoors.  Once a month, do a whole-body skin exam, using a mirror to look at the skin on your back. Tell your health care provider about new moles, moles that have irregular borders, moles that are larger than a pencil eraser, or moles that have changed in shape or color.  Stay current with required vaccines (immunizations).  Influenza vaccine. All adults should be immunized every year.  Tetanus, diphtheria, and acellular pertussis (Td, Tdap) vaccine. An adult who has not previously received Tdap or who does not know his vaccine status should receive 1 dose of Tdap. This initial dose should be followed by tetanus and diphtheria toxoids (Td) booster doses every 10 years. Adults with an unknown or incomplete history of completing a 3-dose immunization series with Td-containing vaccines should begin or complete a primary immunization series including a Tdap dose. Adults should receive a Td booster every 10 years.  Varicella vaccine. An adult without evidence of immunity to varicella should receive 2 doses or a second dose if he has previously received 1 dose.  Human papillomavirus (HPV) vaccine. Males aged 37-21 years who have not received the vaccine previously should receive the 3-dose series. Males aged 22-26 years may be immunized. Immunization is recommended through the age of 75 years for any male who has sex with males and did not get any or all doses earlier. Immunization is recommended for any person with an immunocompromised condition through the age of 27 years if he did not get any or all doses earlier. During the 3-dose series, the second dose should be  obtained 4-8 weeks after the first dose. The third dose should be obtained 24 weeks after the first dose and 16 weeks after the second dose.  Zoster vaccine. One dose is recommended for adults aged 61 years or older unless certain conditions are present.  Measles, mumps, and rubella (MMR) vaccine. Adults born before [birth year] generally are considered immune to measles and mumps. Adults born in [birth year] or later should have 1 or more doses of MMR vaccine unless there is a contraindication to the vaccine or there is laboratory evidence of immunity to each of the three diseases. A routine second dose of MMR vaccine should be obtained at least 28 days after the first  dose for students attending postsecondary schools, health care workers, or international travelers. People who received inactivated measles vaccine or an unknown type of measles vaccine during 1963-1967 should receive 2 doses of MMR vaccine. People who received inactivated mumps vaccine or an unknown type of mumps vaccine before 1979 and are at high risk for mumps infection should consider immunization with 2 doses of MMR vaccine. Unvaccinated health care workers born before [birth year] who lack laboratory evidence of measles, mumps, or rubella immunity or laboratory confirmation of disease should consider measles and mumps immunization with 2 doses of MMR vaccine or rubella immunization with 1 dose of MMR vaccine.  Pneumococcal 13-valent conjugate (PCV13) vaccine. When indicated, a person who is uncertain of his immunization history and has no record of immunization should receive the PCV13 vaccine. An adult aged 53 years or older who has certain medical conditions and has not been previously immunized should receive 1 dose of PCV13 vaccine. This PCV13 should be followed with a dose of pneumococcal polysaccharide (PPSV23) vaccine. The PPSV23 vaccine dose should be obtained at least 8 weeks after the dose of PCV13 vaccine. An adult aged 23 years or older who has  certain medical conditions and previously received 1 or more doses of PPSV23 vaccine should receive 1 dose of PCV13. The PCV13 vaccine dose should be obtained 1 or more years after the last PPSV23 vaccine dose.  Pneumococcal polysaccharide (PPSV23) vaccine. When PCV13 is also indicated, PCV13 should be obtained first. All adults aged 62 years and older should be immunized. An adult younger than age 18 years who has certain medical conditions should be immunized. Any person who resides in a nursing home or long-term care facility should be immunized. An adult smoker should be immunized. People with an immunocompromised condition and certain other conditions should receive both PCV13 and PPSV23 vaccines. People with human immunodeficiency virus (HIV) infection should be immunized as soon as possible after diagnosis. Immunization during chemotherapy or radiation therapy should be avoided. Routine use of PPSV23 vaccine is not recommended for American Indians, [place], or people younger than 65 years unless there are medical conditions that require PPSV23 vaccine. When indicated, people who have unknown immunization and have no record of immunization should receive PPSV23 vaccine. One-time revaccination 5 years after the first dose of PPSV23 is recommended for people aged 19-64 years who have chronic kidney failure, nephrotic syndrome, asplenia, or immunocompromised conditions. People who received 1-2 doses of PPSV23 before age 27 years should receive another dose of PPSV23 vaccine at age 49 years or later if at least 5 years have passed since the previous dose. Doses of PPSV23 are not needed for people immunized with PPSV23 at or after age 55 years.  Meningococcal vaccine. Adults with asplenia or persistent complement component deficiencies should receive 2 doses of quadrivalent meningococcal conjugate (MenACWY-D) vaccine. The doses should be obtained at least 2 months apart. Microbiologists working with  certain meningococcal bacteria, [employer] recruits, people at risk during an outbreak, and people who travel to or live in countries with a high rate of meningitis should be immunized. A first-year college student up through age 13 years who is living in a residence hall should receive a dose if he did not receive a dose on or after his 16th birthday. Adults who have certain high-risk conditions should receive one or more doses of vaccine.  Hepatitis A vaccine. Adults who wish to be protected from this disease, have certain high-risk conditions, work with hepatitis A-infected animals, work in  hepatitis A research labs, or travel to or work in countries with a high rate of hepatitis A should be immunized. Adults who were previously unvaccinated and who anticipate close contact with an international adoptee during the first 60 days after arrival in the [place] from a country with a high rate of hepatitis A should be immunized.  Hepatitis B vaccine. Adults should be immunized if they wish to be protected from this disease, have certain high-risk conditions, may be exposed to blood or other infectious body fluids, are household contacts or sex partners of hepatitis B positive people, are clients or workers in certain care facilities, or travel to or work in countries with a high rate of hepatitis B.  Haemophilus influenzae type b (Hib) vaccine. A previously unvaccinated person with asplenia or sickle cell disease or having a scheduled splenectomy should receive 1 dose of Hib vaccine. Regardless of previous immunization, a recipient of a hematopoietic stem cell transplant should receive a 3-dose series 6-12 months after his successful transplant. Hib vaccine is not recommended for adults with HIV infection. Preventive Service / Frequency Ages 27 to 35  Blood pressure check.** / Every 1 to 2 years.  Lipid and cholesterol check.** / Every 5 years beginning at age 1.  Hepatitis C blood test.** / For any  individual with known risks for hepatitis C.  Skin self-exam. / Monthly.  Influenza vaccine. / Every year.  Tetanus, diphtheria, and acellular pertussis (Tdap, Td) vaccine.** / Consult your health care provider. 1 dose of Td every 10 years.  Varicella vaccine.** / Consult your health care provider.  HPV vaccine. / 3 doses over 6 months, if 27 or younger.  Measles, mumps, rubella (MMR) vaccine.** / You need at least 1 dose of MMR if you were born in 1957 or later. You may also need a second dose.  Pneumococcal 13-valent conjugate (PCV13) vaccine.** / Consult your health care provider.  Pneumococcal polysaccharide (PPSV23) vaccine.** / 1 to 2 doses if you smoke cigarettes or if you have certain conditions.  Meningococcal vaccine.** / 1 dose if you are age 28 to 7 years and a Market researcher living in a residence hall, or have one of several medical conditions. You may also need additional booster doses.  Hepatitis A vaccine.** / Consult your health care provider.  Hepatitis B vaccine.** / Consult your health care provider.  Haemophilus influenzae type b (Hib) vaccine.** / Consult your health care provider. Ages 42 to 63  Blood pressure check.** / Every 1 to 2 years.  Lipid and cholesterol check.** / Every 5 years beginning at age 69.  Lung cancer screening. / Every year if you are aged 27-80 years and have a 30-pack-year history of smoking and currently smoke or have quit within the past 15 years. Yearly screening is stopped once you have quit smoking for at least 15 years or develop a health problem that would prevent you from having lung cancer treatment.  Fecal occult blood test (FOBT) of stool. / Every year beginning at age 65 and continuing until age 47. You may not have to do this test if you get a colonoscopy every 10 years.  Flexible sigmoidoscopy** or colonoscopy.** / Every 5 years for a flexible sigmoidoscopy or every 10 years for a colonoscopy beginning at age  83 and continuing until age 57.  Hepatitis C blood test.** / For all people born from [birth year] through 1963 and any individual with known risks for hepatitis C.  Skin self-exam. / Monthly.  Influenza vaccine. / Every year.  Tetanus, diphtheria, and acellular pertussis (Tdap/Td) vaccine.** / Consult your health care provider. 1 dose of Td every 10 years.  Varicella vaccine.** / Consult your health care provider.  Zoster vaccine.** / 1 dose for adults aged 71 years or older.  Measles, mumps, rubella (MMR) vaccine.** / You need at least 1 dose of MMR if you were born in 1957 or later. You may also need a second dose.  Pneumococcal 13-valent conjugate (PCV13) vaccine.** / Consult your health care provider.  Pneumococcal polysaccharide (PPSV23) vaccine.** / 1 to 2 doses if you smoke cigarettes or if you have certain conditions.  Meningococcal vaccine.** / Consult your health care provider.  Hepatitis A vaccine.** / Consult your health care provider.  Hepatitis B vaccine.** / Consult your health care provider.  Haemophilus influenzae type b (Hib) vaccine.** / Consult your health care provider. Ages 86 and over  Blood pressure check.** / Every 1 to 2 years.  Lipid and cholesterol check.**/ Every 5 years beginning at age 76.  Lung cancer screening. / Every year if you are aged 60-80 years and have a 30-pack-year history of smoking and currently smoke or have quit within the past 15 years. Yearly screening is stopped once you have quit smoking for at least 15 years or develop a health problem that would prevent you from having lung cancer treatment.  Fecal occult blood test (FOBT) of stool. / Every year beginning at age 109 and continuing until age 37. You may not have to do this test if you get a colonoscopy every 10 years.  Flexible sigmoidoscopy** or colonoscopy.** / Every 5 years for a flexible sigmoidoscopy or every 10 years for a colonoscopy beginning at age 70 and continuing until age  48.  Hepatitis C blood test.** / For all people born from [birth year] through 1963 and any individual with known risks for hepatitis C.  Abdominal aortic aneurysm (AAA) screening.** / A one-time screening for ages 89 to 41 years who are current or former smokers.  Skin self-exam. / Monthly.  Influenza vaccine. / Every year.  Tetanus, diphtheria, and acellular pertussis (Tdap/Td) vaccine.** / 1 dose of Td every 10 years.  Varicella vaccine.** / Consult your health care provider.  Zoster vaccine.** / 1 dose for adults aged 90 years or older.  Pneumococcal 13-valent conjugate (PCV13) vaccine.** / Consult your health care provider.  Pneumococcal polysaccharide (PPSV23) vaccine.** / 1 dose for all adults aged 53 years and older.  Meningococcal vaccine.** / Consult your health care provider.  Hepatitis A vaccine.** / Consult your health care provider.  Hepatitis B vaccine.** / Consult your health care provider.  Haemophilus influenzae type b (Hib) vaccine.** / Consult your health care provider. **Family history and personal history of risk and conditions may change your health care provider's recommendations. Document Released: 08/02/2001 Document Revised: 06/11/2013 Document Reviewed: 11/01/2010 [facility] Patient Information 2015 [place], [place]. This information is not intended to replace advice given to you by your health care provider. Make sure you discuss any questions you have with your health care provider.

## 2015-01-12 NOTE — Progress Notes (Signed)
Pre visit review using our clinic review tool, if applicable. No additional management support is needed unless otherwise documented below in the visit note. 

## 2015-01-12 NOTE — Progress Notes (Addendum)
Subjective:  Patient ID: Danny Gutierrez, male    DOB: 03-30-1946  Age: 69 y.o. MRN: [id number]  CC: No chief complaint on file.   HPI Danny Gutierrez presents for well exam.  Outpatient Prescriptions Prior to Visit  Medication Sig Dispense Refill  . Cholecalciferol 1000 UNITS tablet Take 1,000 Units by mouth daily.    . Multiple Vitamins-Minerals (OCUVITE EYE HEALTH FORMULA PO) Take by mouth daily.    . hydrogen peroxide 1.5 % SOLN Apply topically 2 (two) times daily.     No facility-administered medications prior to visit.    ROS Review of Systems  Constitutional: Negative for appetite change, fatigue and unexpected weight change.  HENT: Negative for congestion, nosebleeds, sneezing, sore throat and trouble swallowing.   Eyes: Negative for itching and visual disturbance.  Respiratory: Negative for cough.   Cardiovascular: Negative for chest pain, palpitations and leg swelling.  Gastrointestinal: Negative for nausea, diarrhea, blood in stool and abdominal distention.  Genitourinary: Negative for frequency and hematuria.  Musculoskeletal: Negative for back pain, joint swelling, gait problem and neck pain.  Skin: Negative for rash.  Neurological: Negative for dizziness, tremors, speech difficulty and weakness.  Psychiatric/Behavioral: Negative for suicidal ideas, sleep disturbance, dysphoric mood and agitation. The patient is not nervous/anxious.   rare palpitations  Objective:  BP 140/90 mmHg  Pulse 77  Resp 12  Ht 5\' 6"  (1.676 m)  Wt 173 lb (78.472 kg)  BMI 27.94 kg/m2  SpO2 96%  BP Readings from Last 3 Encounters:  01/12/15 140/90  01/10/14 140/80  01/08/13 122/88    Wt Readings from Last 3 Encounters:  01/12/15 173 lb (78.472 kg)  01/10/14 176 lb (79.833 kg)  01/08/13 156 lb (70.761 kg)    Physical Exam  Constitutional: He is oriented to person, place, and time. He appears well-developed and well-nourished. No distress.  HENT:  Head:  Normocephalic and atraumatic.  Right Ear: External ear normal.  Left Ear: External ear normal.  Nose: Nose normal.  Mouth/Throat: Oropharynx is clear and moist. No oropharyngeal exudate.  Eyes: Conjunctivae and EOM are normal. Pupils are equal, round, and reactive to light. Right eye exhibits no discharge. Left eye exhibits no discharge. No scleral icterus.  Neck: Normal range of motion. Neck supple. No JVD present. No tracheal deviation present. No thyromegaly present.  Cardiovascular: Normal rate, regular rhythm, normal heart sounds and intact distal pulses.  Exam reveals no gallop and no friction rub.   No murmur heard. Pulmonary/Chest: Effort normal and breath sounds normal. No stridor. No respiratory distress. He has no wheezes. He has no rales. He exhibits no tenderness.  Abdominal: Soft. Bowel sounds are normal. He exhibits no distension and no mass. There is no tenderness. There is no rebound and no guarding.  Genitourinary: Rectum normal, prostate normal and penis normal. Guaiac negative stool. No penile tenderness.  Musculoskeletal: Normal range of motion. He exhibits no edema or tenderness.  Lymphadenopathy:    He has no cervical adenopathy.  Neurological: He is alert and oriented to person, place, and time. He has normal reflexes. No cranial nerve deficit. He exhibits normal muscle tone. Coordination normal.  Skin: Skin is warm and dry. No rash noted. He is not diaphoretic. No erythema. No pallor.  Psychiatric: He has a normal mood and affect. His behavior is normal. Judgment and thought content normal.  Prostate 1+ with a R lobe 3-4 mm nodule  EKG WNL  Lab Results  Component Value Date   WBC 8.6 01/12/2015  HGB 15.7 01/12/2015   HCT 47.0 01/12/2015   PLT 213.0 01/12/2015   GLUCOSE 96 01/12/2015   CHOL 192 01/12/2015   TRIG 186.0* 01/12/2015   HDL 32.50* 01/12/2015   LDLDIRECT 175.3 01/08/2013   LDLCALC 122* 01/12/2015   ALT 18 01/12/2015   AST 14 01/12/2015   NA 140  01/12/2015   K 3.8 01/12/2015   CL 106 01/12/2015   CREATININE 0.77 01/12/2015   BUN 15 01/12/2015   CO2 25 01/12/2015   TSH 2.18 01/12/2015   PSA 2.99 01/12/2015    Patient was never admitted.  Assessment & Plan:   Diagnoses and all orders for this visit:  Well adult exam Orders: -     Basic metabolic panel; Future -     CBC with Differential/Platelet; Future -     Hepatic function panel; Future -     Lipid panel; Future -     PSA; Future -     Sedimentation rate; Future -     TSH; Future -     Urinalysis; Future -     EKG 12-Lead  Palpitations Orders: -     Basic metabolic panel; Future -     CBC with Differential/Platelet; Future -     Hepatic function panel; Future -     Lipid panel; Future -     PSA; Future -     Sedimentation rate; Future -     TSH; Future -     Urinalysis; Future  Bladder neck obstruction Orders: -     Basic metabolic panel; Future -     CBC with Differential/Platelet; Future -     Hepatic function panel; Future -     Lipid panel; Future -     PSA; Future -     Sedimentation rate; Future -     TSH; Future -     Urinalysis; Future  Dyslipidemia Orders: -     Basic metabolic panel; Future -     CBC with Differential/Platelet; Future -     Hepatic function panel; Future -     Lipid panel; Future -     PSA; Future -     Sedimentation rate; Future -     TSH; Future -     Urinalysis; Future  Prostate nodule   I have discontinued Danny Gutierrez's hydrogen peroxide. I am also having him maintain his Multiple Vitamins-Minerals (OCUVITE EYE HEALTH FORMULA PO) and Cholecalciferol.  No orders of the defined types were placed in this encounter.     Follow-up: Return in about 2 months (around 03/15/2015) for a follow-up visit.  Walker Kehr, MD

## 2015-01-12 NOTE — Assessment & Plan Note (Signed)
Here for medicare wellness/physical  Diet: heart healthy  Physical activity: not sedentary  Depression/mood screen: negative  Hearing: intact to whispered voice  Visual acuity: grossly normal, performs annual eye exam  ADLs: capable  Fall risk: none  Home safety: good  Cognitive evaluation: intact to orientation, naming, recall and repetition  EOL planning: adv directives, full code/ I agree  I have personally reviewed and have noted  1. The patient's medical and social history  2. Their use of alcohol, tobacco or illicit drugs  3. Their current medications and supplements  4. The patient's functional ability including ADL's, fall risks, home safety risks and hearing or visual impairment.  5. Diet and physical activities  6. Evidence for depression or mood disorders 7. Roster of providers is reviewed    Today patient counseled on age appropriate routine health concerns for screening and prevention, each reviewed and up to date or declined. Immunizations reviewed and up to date or declined. Labs ordered and reviewed. Risk factors for depression reviewed and negative. Hearing function and visual acuity are intact. ADLs screened and addressed as needed. Functional ability and level of safety reviewed and appropriate. Education, counseling and referrals performed based on assessed risks today. Patient provided with a copy of personalized plan for preventive services.   Cologuard info Colonoscopy discussed Declined all shots

## 2015-04-02 DIAGNOSIS — H5203 Hypermetropia, bilateral: Secondary | ICD-10-CM | POA: Diagnosis not present

## 2015-04-02 DIAGNOSIS — H2513 Age-related nuclear cataract, bilateral: Secondary | ICD-10-CM | POA: Diagnosis not present

## 2015-04-02 DIAGNOSIS — H2589 Other age-related cataract: Secondary | ICD-10-CM | POA: Diagnosis not present

## 2015-04-02 DIAGNOSIS — H524 Presbyopia: Secondary | ICD-10-CM | POA: Diagnosis not present

## 2015-04-02 DIAGNOSIS — Z8679 Personal history of other diseases of the circulatory system: Secondary | ICD-10-CM | POA: Diagnosis not present

## 2015-04-02 DIAGNOSIS — H40013 Open angle with borderline findings, low risk, bilateral: Secondary | ICD-10-CM | POA: Diagnosis not present

## 2015-04-02 DIAGNOSIS — Z8249 Family history of ischemic heart disease and other diseases of the circulatory system: Secondary | ICD-10-CM | POA: Diagnosis not present

## 2015-09-16 DIAGNOSIS — R69 Illness, unspecified: Secondary | ICD-10-CM | POA: Diagnosis not present

## 2015-10-20 DIAGNOSIS — H268 Other specified cataract: Secondary | ICD-10-CM | POA: Insufficient documentation

## 2015-10-20 DIAGNOSIS — H524 Presbyopia: Secondary | ICD-10-CM | POA: Insufficient documentation

## 2015-10-20 DIAGNOSIS — H2513 Age-related nuclear cataract, bilateral: Secondary | ICD-10-CM | POA: Insufficient documentation

## 2015-10-21 DIAGNOSIS — H5203 Hypermetropia, bilateral: Secondary | ICD-10-CM | POA: Diagnosis not present

## 2015-10-21 DIAGNOSIS — H2513 Age-related nuclear cataract, bilateral: Secondary | ICD-10-CM | POA: Diagnosis not present

## 2015-10-21 DIAGNOSIS — H524 Presbyopia: Secondary | ICD-10-CM | POA: Diagnosis not present

## 2015-10-21 DIAGNOSIS — H40013 Open angle with borderline findings, low risk, bilateral: Secondary | ICD-10-CM | POA: Diagnosis not present

## 2015-10-21 DIAGNOSIS — H2589 Other age-related cataract: Secondary | ICD-10-CM | POA: Diagnosis not present

## 2015-11-17 DIAGNOSIS — R69 Illness, unspecified: Secondary | ICD-10-CM | POA: Diagnosis not present

## 2016-01-13 ENCOUNTER — Other Ambulatory Visit (INDEPENDENT_AMBULATORY_CARE_PROVIDER_SITE_OTHER): Payer: Medicare HMO

## 2016-01-13 ENCOUNTER — Ambulatory Visit (INDEPENDENT_AMBULATORY_CARE_PROVIDER_SITE_OTHER): Payer: Medicare HMO | Admitting: Internal Medicine

## 2016-01-13 ENCOUNTER — Ambulatory Visit (INDEPENDENT_AMBULATORY_CARE_PROVIDER_SITE_OTHER)
Admission: RE | Admit: 2016-01-13 | Discharge: 2016-01-13 | Disposition: A | Discharge status: Home / Self Care | Payer: Medicare HMO | Source: Ambulatory Visit | Attending: Internal Medicine | Admitting: Internal Medicine

## 2016-01-13 ENCOUNTER — Encounter: Payer: Self-pay | Admitting: Internal Medicine

## 2016-01-13 VITALS — BP 124/80 | HR 81 | Ht 66.0 in | Wt 176.0 lb

## 2016-01-13 DIAGNOSIS — R05 Cough: Secondary | ICD-10-CM | POA: Diagnosis not present

## 2016-01-13 DIAGNOSIS — L723 Sebaceous cyst: Secondary | ICD-10-CM

## 2016-01-13 DIAGNOSIS — R059 Cough, unspecified: Secondary | ICD-10-CM | POA: Insufficient documentation

## 2016-01-13 DIAGNOSIS — Z0000 Encounter for general adult medical examination without abnormal findings: Secondary | ICD-10-CM | POA: Diagnosis not present

## 2016-01-13 DIAGNOSIS — N402 Nodular prostate without lower urinary tract symptoms: Secondary | ICD-10-CM | POA: Diagnosis not present

## 2016-01-13 DIAGNOSIS — Z23 Encounter for immunization: Secondary | ICD-10-CM

## 2016-01-13 LAB — HEPATIC FUNCTION PANEL
ALBUMIN: 4.2 g/dL (ref 3.5–5.2)
ALT: 21 U/L (ref 0–53)
AST: 16 U/L (ref 0–37)
Alkaline Phosphatase: 70 U/L (ref 39–117)
Bilirubin, Direct: 0.1 mg/dL (ref 0.0–0.3)
Total Bilirubin: 0.9 mg/dL (ref 0.2–1.2)
Total Protein: 7 g/dL (ref 6.0–8.3)

## 2016-01-13 LAB — URINALYSIS
Hgb urine dipstick: NEGATIVE
Leukocytes, UA: NEGATIVE
Nitrite: NEGATIVE
PH: 5 (ref 5.0–8.0)
URINE GLUCOSE: NEGATIVE
UROBILINOGEN UA: 0.2 (ref 0.0–1.0)

## 2016-01-13 LAB — CBC WITH DIFFERENTIAL/PLATELET
BASOS PCT: 0.2 % (ref 0.0–3.0)
Basophils Absolute: 0 10*3/uL (ref 0.0–0.1)
EOS ABS: 0.1 10*3/uL (ref 0.0–0.7)
Eosinophils Relative: 0.9 % (ref 0.0–5.0)
HCT: 46.9 % (ref 39.0–52.0)
HEMOGLOBIN: 15.9 g/dL (ref 13.0–17.0)
Lymphocytes Relative: 23.5 % (ref 12.0–46.0)
Lymphs Abs: 2.1 10*3/uL (ref 0.7–4.0)
MCHC: 33.8 g/dL (ref 30.0–36.0)
MCV: 81.6 fl (ref 78.0–100.0)
MONO ABS: 0.5 10*3/uL (ref 0.1–1.0)
Monocytes Relative: 6.2 % (ref 3.0–12.0)
Neutro Abs: 6 10*3/uL (ref 1.4–7.7)
Neutrophils Relative %: 69.2 % (ref 43.0–77.0)
Platelets: 208 10*3/uL (ref 150.0–400.0)
RBC: 5.75 Mil/uL (ref 4.22–5.81)
RDW: 15.1 % (ref 11.5–15.5)
WBC: 8.7 10*3/uL (ref 4.0–10.5)

## 2016-01-13 LAB — SEDIMENTATION RATE: Sed Rate: 24 mm/hr — ABNORMAL HIGH (ref 0–20)

## 2016-01-13 LAB — BASIC METABOLIC PANEL
BUN: 15 mg/dL (ref 6–23)
CALCIUM: 9.3 mg/dL (ref 8.4–10.5)
CO2: 26 mEq/L (ref 19–32)
CREATININE: 0.75 mg/dL (ref 0.40–1.50)
Chloride: 105 mEq/L (ref 96–112)
GFR: 109.92 mL/min (ref >60.00)
Glucose, Bld: 108 mg/dL — ABNORMAL HIGH (ref 70–99)
Potassium: 3.6 mEq/L (ref 3.5–5.1)
Sodium: 140 mEq/L (ref 135–145)

## 2016-01-13 LAB — TSH: TSH: 2.03 u[IU]/mL (ref 0.35–4.50)

## 2016-01-13 LAB — PSA: PSA: 2.6 ng/mL (ref 0.10–4.00)

## 2016-01-13 NOTE — Patient Instructions (Signed)

## 2016-01-13 NOTE — Progress Notes (Signed)
Subjective:  Patient ID: Danny Gutierrez, male    DOB: 1945-07-03  Age: 70 y.o. MRN: [id number]  CC: No chief complaint on file.   HPI Danny Gutierrez presents for well exam. C/o some cough - post URI  Outpatient Medications Prior to Visit  Medication Sig Dispense Refill  . Cholecalciferol 1000 UNITS tablet Take 1,000 Units by mouth daily.    . Multiple Vitamins-Minerals (OCUVITE EYE HEALTH FORMULA PO) Take by mouth daily.     No facility-administered medications prior to visit.     ROS Review of Systems  Constitutional: Negative for appetite change, fatigue and unexpected weight change.  HENT: Negative for congestion, nosebleeds, sneezing, sore throat and trouble swallowing.   Eyes: Negative for itching and visual disturbance.  Respiratory: Negative for cough.   Cardiovascular: Negative for chest pain, palpitations and leg swelling.  Gastrointestinal: Negative for abdominal distention, blood in stool, diarrhea and nausea.  Genitourinary: Negative for frequency and hematuria.  Musculoskeletal: Negative for back pain, gait problem, joint swelling and neck pain.  Skin: Negative for rash.  Neurological: Negative for dizziness, tremors, speech difficulty and weakness.  Psychiatric/Behavioral: Negative for agitation, dysphoric mood and sleep disturbance. The patient is not nervous/anxious.     Objective:  BP 124/80   Pulse 81   Ht 5\' 6"  (1.676 m)   Wt 176 lb (79.8 kg)   SpO2 97%   BMI 28.41 kg/m   BP Readings from Last 3 Encounters:  01/13/16 124/80  01/12/15 140/90  01/10/14 140/80    Wt Readings from Last 3 Encounters:  01/13/16 176 lb (79.8 kg)  01/12/15 173 lb (78.5 kg)  01/10/14 176 lb (79.8 kg)    Physical Exam  Constitutional: He is oriented to person, place, and time. He appears well-developed and well-nourished. No distress.  HENT:  Head: Normocephalic and atraumatic.  Right Ear: External ear normal.  Left Ear: External ear normal.  Nose:  Nose normal.  Mouth/Throat: Oropharynx is clear and moist. No oropharyngeal exudate.  Eyes: Conjunctivae and EOM are normal. Pupils are equal, round, and reactive to light. Right eye exhibits no discharge. Left eye exhibits no discharge. No scleral icterus.  Neck: Normal range of motion. Neck supple. No JVD present. No tracheal deviation present. No thyromegaly present.  Cardiovascular: Normal rate, regular rhythm, normal heart sounds and intact distal pulses.  Exam reveals no gallop and no friction rub.   No murmur heard. Pulmonary/Chest: Effort normal and breath sounds normal. No stridor. No respiratory distress. He has no wheezes. He has no rales. He exhibits no tenderness.  Abdominal: Soft. Bowel sounds are normal. He exhibits no distension and no mass. There is no tenderness. There is no rebound and no guarding.  Genitourinary: Rectum normal and penis normal. Rectal exam shows guaiac negative stool. No penile tenderness.  Musculoskeletal: Normal range of motion. He exhibits no edema or tenderness.  Lymphadenopathy:    He has no cervical adenopathy.  Neurological: He is alert and oriented to person, place, and time. He has normal reflexes. No cranial nerve deficit. He exhibits normal muscle tone. Coordination normal.  Skin: Skin is warm and dry. No rash noted. He is not diaphoretic. No erythema. No pallor.  Psychiatric: He has a normal mood and affect. His behavior is normal. Judgment and thought content normal.  Prostate 1+   Procedure: EKG Indication: cough; well exam Impression: NSR. No acute changes.   Lab Results  Component Value Date   WBC 8.6 01/12/2015   HGB 15.7 01/12/2015  HCT 47.0 01/12/2015   PLT 213.0 01/12/2015   GLUCOSE 96 01/12/2015   CHOL 192 01/12/2015   TRIG 186.0 (H) 01/12/2015   HDL 32.50 (L) 01/12/2015   LDLDIRECT 175.3 01/08/2013   LDLCALC 122 (H) 01/12/2015   ALT 18 01/12/2015   AST 14 01/12/2015   NA 140 01/12/2015   K 3.8 01/12/2015   CL 106  01/12/2015   CREATININE 0.77 01/12/2015   BUN 15 01/12/2015   CO2 25 01/12/2015   TSH 2.18 01/12/2015   PSA 2.99 01/12/2015    Patient was never admitted.  Assessment & Plan:   There are no diagnoses linked to this encounter. I am having Mr. Silas maintain his Multiple Vitamins-Minerals (OCUVITE EYE HEALTH FORMULA PO) and Cholecalciferol.  No orders of the defined types were placed in this encounter.    Follow-up: No Follow-up on file.  Walker Kehr, MD

## 2016-01-13 NOTE — Assessment & Plan Note (Signed)
Unchanged exam.

## 2016-01-13 NOTE — Assessment & Plan Note (Signed)
?  post-viral summer 2017 CXR ESR

## 2016-01-13 NOTE — Progress Notes (Signed)
Pre visit review using our clinic review tool, if applicable. No additional management support is needed unless otherwise documented below in the visit note. 

## 2016-01-13 NOTE — Assessment & Plan Note (Signed)
Discussed again

## 2016-01-13 NOTE — Assessment & Plan Note (Addendum)
Here for medicare wellness/physical  Diet: heart healthy  Physical activity: not sedentary  Depression/mood screen: negative  Hearing: intact to whispered voice  Visual acuity: grossly normal, performs annual eye exam  ADLs: capable  Fall risk: none  Home safety: good  Cognitive evaluation: intact to orientation, naming, recall and repetition  EOL planning: adv directives, full code/ I agree  I have personally reviewed and have noted  1. The patient's medical and social history  2. Their use of alcohol, tobacco or illicit drugs  3. Their current medications and supplements  4. The patient's functional ability including ADL's, fall risks, home safety risks and hearing or visual impairment.  5. Diet and physical activities  6. Evidence for depression or mood disorders 7. Roster of providers is reviewed    Today patient counseled on age appropriate routine health concerns for screening and prevention, each reviewed and up to date or declined. Immunizations reviewed and up to date or declined. Labs ordered and reviewed. Risk factors for depression reviewed and negative. Hearing function and visual acuity are intact. ADLs screened and addressed as needed. Functional ability and level of safety reviewed and appropriate. Education, counseling and referrals performed based on assessed risks today. Patient provided with a copy of personalized plan for preventive services.   Cologuard info Labs Pneumovax Declined tDAP

## 2016-02-10 DIAGNOSIS — D3709 Neoplasm of uncertain behavior of other specified sites of the oral cavity: Secondary | ICD-10-CM | POA: Diagnosis not present

## 2016-02-10 DIAGNOSIS — H903 Sensorineural hearing loss, bilateral: Secondary | ICD-10-CM | POA: Diagnosis not present

## 2016-04-09 ENCOUNTER — Ambulatory Visit (INDEPENDENT_AMBULATORY_CARE_PROVIDER_SITE_OTHER): Payer: Medicare HMO

## 2016-04-09 DIAGNOSIS — Z23 Encounter for immunization: Secondary | ICD-10-CM | POA: Diagnosis not present

## 2016-04-22 DIAGNOSIS — H2589 Other age-related cataract: Secondary | ICD-10-CM | POA: Diagnosis not present

## 2016-04-22 DIAGNOSIS — H5203 Hypermetropia, bilateral: Secondary | ICD-10-CM | POA: Diagnosis not present

## 2016-04-22 DIAGNOSIS — H2513 Age-related nuclear cataract, bilateral: Secondary | ICD-10-CM | POA: Diagnosis not present

## 2016-04-22 DIAGNOSIS — H40013 Open angle with borderline findings, low risk, bilateral: Secondary | ICD-10-CM | POA: Diagnosis not present

## 2016-04-22 DIAGNOSIS — H524 Presbyopia: Secondary | ICD-10-CM | POA: Diagnosis not present

## 2016-07-27 DIAGNOSIS — R69 Illness, unspecified: Secondary | ICD-10-CM | POA: Diagnosis not present

## 2016-10-21 DIAGNOSIS — H5203 Hypermetropia, bilateral: Secondary | ICD-10-CM | POA: Diagnosis not present

## 2016-10-21 DIAGNOSIS — H2513 Age-related nuclear cataract, bilateral: Secondary | ICD-10-CM | POA: Diagnosis not present

## 2016-10-21 DIAGNOSIS — H268 Other specified cataract: Secondary | ICD-10-CM | POA: Diagnosis not present

## 2016-10-21 DIAGNOSIS — H524 Presbyopia: Secondary | ICD-10-CM | POA: Diagnosis not present

## 2016-10-21 DIAGNOSIS — H401431 Capsular glaucoma with pseudoexfoliation of lens, bilateral, mild stage: Secondary | ICD-10-CM | POA: Diagnosis not present

## 2016-11-16 DIAGNOSIS — N63 Unspecified lump in unspecified breast: Secondary | ICD-10-CM | POA: Diagnosis not present

## 2016-11-16 DIAGNOSIS — N62 Hypertrophy of breast: Secondary | ICD-10-CM | POA: Diagnosis not present

## 2016-11-16 DIAGNOSIS — R69 Illness, unspecified: Secondary | ICD-10-CM | POA: Diagnosis not present

## 2016-11-17 DIAGNOSIS — Z23 Encounter for immunization: Secondary | ICD-10-CM | POA: Diagnosis not present

## 2016-12-30 DIAGNOSIS — H401431 Capsular glaucoma with pseudoexfoliation of lens, bilateral, mild stage: Secondary | ICD-10-CM | POA: Diagnosis not present

## 2017-01-13 ENCOUNTER — Encounter: Payer: Medicare HMO | Admitting: Internal Medicine

## 2017-01-17 DIAGNOSIS — R69 Illness, unspecified: Secondary | ICD-10-CM | POA: Diagnosis not present

## 2017-01-24 ENCOUNTER — Telehealth: Payer: Self-pay | Admitting: Internal Medicine

## 2017-01-24 ENCOUNTER — Ambulatory Visit (INDEPENDENT_AMBULATORY_CARE_PROVIDER_SITE_OTHER): Payer: Medicare HMO | Admitting: Internal Medicine

## 2017-01-24 ENCOUNTER — Other Ambulatory Visit (INDEPENDENT_AMBULATORY_CARE_PROVIDER_SITE_OTHER): Payer: Medicare HMO

## 2017-01-24 ENCOUNTER — Encounter: Payer: Self-pay | Admitting: Internal Medicine

## 2017-01-24 VITALS — BP 128/86 | HR 75 | Temp 98.7°F | Ht 66.0 in | Wt 178.0 lb

## 2017-01-24 DIAGNOSIS — Z23 Encounter for immunization: Secondary | ICD-10-CM | POA: Diagnosis not present

## 2017-01-24 DIAGNOSIS — R002 Palpitations: Secondary | ICD-10-CM

## 2017-01-24 DIAGNOSIS — N402 Nodular prostate without lower urinary tract symptoms: Secondary | ICD-10-CM

## 2017-01-24 DIAGNOSIS — L723 Sebaceous cyst: Secondary | ICD-10-CM

## 2017-01-24 DIAGNOSIS — Z0000 Encounter for general adult medical examination without abnormal findings: Secondary | ICD-10-CM

## 2017-01-24 DIAGNOSIS — H409 Unspecified glaucoma: Secondary | ICD-10-CM | POA: Insufficient documentation

## 2017-01-24 DIAGNOSIS — E785 Hyperlipidemia, unspecified: Secondary | ICD-10-CM | POA: Diagnosis not present

## 2017-01-24 DIAGNOSIS — Z0001 Encounter for general adult medical examination with abnormal findings: Secondary | ICD-10-CM | POA: Diagnosis not present

## 2017-01-24 LAB — BASIC METABOLIC PANEL
BUN: 16 mg/dL (ref 6–23)
CALCIUM: 9.3 mg/dL (ref 8.4–10.5)
CO2: 26 mEq/L (ref 19–32)
CREATININE: 0.81 mg/dL (ref 0.40–1.50)
Chloride: 103 mEq/L (ref 96–112)
GFR: 100.27 mL/min (ref >60.00)
GLUCOSE: 105 mg/dL — AB (ref 70–99)
POTASSIUM: 3.6 meq/L (ref 3.5–5.1)
Sodium: 137 mEq/L (ref 135–145)

## 2017-01-24 LAB — CBC WITH DIFFERENTIAL/PLATELET
BASOS ABS: 0.1 10*3/uL (ref 0.0–0.1)
Basophils Relative: 0.7 % (ref 0.0–3.0)
EOS ABS: 0.1 10*3/uL (ref 0.0–0.7)
Eosinophils Relative: 1.6 % (ref 0.0–5.0)
HCT: 48.3 % (ref 39.0–52.0)
HEMOGLOBIN: 16.3 g/dL (ref 13.0–17.0)
LYMPHS ABS: 2 10*3/uL (ref 0.7–4.0)
Lymphocytes Relative: 26.5 % (ref 12.0–46.0)
MCHC: 33.7 g/dL (ref 30.0–36.0)
MCV: 83.8 fl (ref 78.0–100.0)
MONO ABS: 0.6 10*3/uL (ref 0.1–1.0)
Monocytes Relative: 7.7 % (ref 3.0–12.0)
NEUTROS PCT: 63.5 % (ref 43.0–77.0)
Neutro Abs: 4.8 10*3/uL (ref 1.4–7.7)
Platelets: 183 10*3/uL (ref 150.0–400.0)
RBC: 5.77 Mil/uL (ref 4.22–5.81)
RDW: 14.8 % (ref 11.5–15.5)
WBC: 7.6 10*3/uL (ref 4.0–10.5)

## 2017-01-24 LAB — LIPID PANEL
CHOL/HDL RATIO: 6
Cholesterol: 224 mg/dL — ABNORMAL HIGH (ref 0–200)
HDL: 40.3 mg/dL (ref >39.00)
LDL Cholesterol: 146 mg/dL — ABNORMAL HIGH (ref 0–99)
NONHDL: 184.04
Triglycerides: 192 mg/dL — ABNORMAL HIGH (ref 0.0–149.0)
VLDL: 38.4 mg/dL (ref 0.0–40.0)

## 2017-01-24 LAB — URINALYSIS
BILIRUBIN URINE: NEGATIVE
HGB URINE DIPSTICK: NEGATIVE
Ketones, ur: NEGATIVE
LEUKOCYTES UA: NEGATIVE
NITRITE: NEGATIVE
PH: 5.5 (ref 5.0–8.0)
Specific Gravity, Urine: >=1.03 — AB (ref 1.000–1.030)
Urine Glucose: NEGATIVE
Urobilinogen, UA: 0.2 (ref 0.0–1.0)

## 2017-01-24 LAB — HEPATIC FUNCTION PANEL
ALT: 13 U/L (ref 0–53)
AST: 12 U/L (ref 0–37)
Albumin: 4.2 g/dL (ref 3.5–5.2)
Alkaline Phosphatase: 65 U/L (ref 39–117)
BILIRUBIN DIRECT: 0.1 mg/dL (ref 0.0–0.3)
BILIRUBIN TOTAL: 0.7 mg/dL (ref 0.2–1.2)
Total Protein: 7.4 g/dL (ref 6.0–8.3)

## 2017-01-24 LAB — TSH: TSH: 2.98 u[IU]/mL (ref 0.35–4.50)

## 2017-01-24 NOTE — Patient Instructions (Signed)

## 2017-01-24 NOTE — Assessment & Plan Note (Signed)
F/u w/Dr R

## 2017-01-24 NOTE — Assessment & Plan Note (Addendum)
Here for medicare wellness/physical  Diet: heart healthy  Physical activity: not sedentary  Depression/mood screen: negative  Hearing: intact to whispered voice  Visual acuity: grossly normal, performs annual eye exam - glaucoma ADLs: capable  Fall risk: low to none  Home safety: good  Cognitive evaluation: intact to orientation, naming, recall and repetition  EOL planning: adv directives, full code/ I agree  I have personally reviewed and have noted  1. The patient's medical, surgical and social history  2. Their use of alcohol, tobacco or illicit drugs  3. Their current medications and supplements  4. The patient's functional ability including ADL's, fall risks, home safety risks and hearing or visual impairment.  5. Diet and physical activities  6. Evidence for depression or mood disorders 7. The roster of all physicians providing medical care to patient - is listed in the Snapshot section of the chart and reviewed today.    Today patient counseled on age appropriate routine health concerns for screening and prevention, each reviewed and up to date or declined. Immunizations reviewed and up to date or declined. Labs ordered and reviewed. Risk factors for depression reviewed and negative. Hearing function and visual acuity are intact. ADLs screened and addressed as needed. Functional ability and level of safety reviewed and appropriate. Education, counseling and referrals performed based on assessed risks today. Patient provided with a copy of personalized plan for preventive services.   Prevnar Declined Shingrix

## 2017-01-24 NOTE — Assessment & Plan Note (Signed)
Scalp [place] -- c/w seb cyst

## 2017-01-24 NOTE — Assessment & Plan Note (Signed)
Prostate: 1-2+ with 5 mm nodule on the R Urol ref adviced

## 2017-01-24 NOTE — Telephone Encounter (Signed)
Pt came back by the front desk after his visit with Dr. Alain Marion and requested his lab results be MAILED to him.  Address in  [facility] was verified with pt to be correct.

## 2017-01-24 NOTE — Addendum Note (Signed)
Addended by: Karren Cobble on: 01/24/2017 02:57 PM   Modules accepted: Orders

## 2017-01-24 NOTE — Progress Notes (Signed)
Subjective:  Patient ID: Danny Gutierrez, male    DOB: 1946-03-07  Age: 71 y.o. MRN: [id number]  CC: No chief complaint on file.   HPI [name] presents for a well exam C/o occ palpitations  Outpatient Medications Prior to Visit  Medication Sig Dispense Refill  . Cholecalciferol 1000 UNITS tablet Take 1,000 Units by mouth daily.    . Multiple Vitamins-Minerals (OCUVITE EYE HEALTH FORMULA PO) Take by mouth daily.     No facility-administered medications prior to visit.     ROS Review of Systems  Constitutional: Negative for appetite change, fatigue and unexpected weight change.  HENT: Negative for congestion, nosebleeds, sneezing, sore throat and trouble swallowing.   Eyes: Negative for itching and visual disturbance.  Respiratory: Negative for cough.   Cardiovascular: Negative for chest pain, palpitations and leg swelling.  Gastrointestinal: Negative for abdominal distention, blood in stool, diarrhea and nausea.  Genitourinary: Negative for frequency and hematuria.  Musculoskeletal: Negative for back pain, gait problem, joint swelling and neck pain.  Skin: Negative for rash.  Neurological: Negative for dizziness, tremors, speech difficulty and weakness.  Psychiatric/Behavioral: Negative for agitation, dysphoric mood, sleep disturbance and suicidal ideas. The patient is not nervous/anxious.     Objective:  BP 128/86 (BP Location: Right Arm, Patient Position: Standing, Cuff Size: Normal)   Pulse 75   Temp 98.7 F (37.1 C) (Oral)   Ht 5\' 6"  (1.676 m)   Wt 178 lb (80.7 kg)   SpO2 99%   BMI 28.73 kg/m   BP Readings from Last 3 Encounters:  01/24/17 128/86  01/13/16 124/80  01/12/15 140/90    Wt Readings from Last 3 Encounters:  01/24/17 178 lb (80.7 kg)  01/13/16 176 lb (79.8 kg)  01/12/15 173 lb (78.5 kg)    Physical Exam  Constitutional: He is oriented to person, place, and time. He appears well-developed. No distress.  NAD  HENT:    Mouth/Throat: Oropharynx is clear and moist.  Eyes: Pupils are equal, round, and reactive to light. Conjunctivae are normal.  Neck: Normal range of motion. No JVD present. No thyromegaly present.  Cardiovascular: Normal rate, regular rhythm, normal heart sounds and intact distal pulses.  Exam reveals no gallop and no friction rub.   No murmur heard. Pulmonary/Chest: Effort normal and breath sounds normal. No respiratory distress. He has no wheezes. He has no rales. He exhibits no tenderness.  Abdominal: Soft. Bowel sounds are normal. He exhibits no distension and no mass. There is no tenderness. There is no rebound and no guarding.  Musculoskeletal: Normal range of motion. He exhibits no edema or tenderness.  Lymphadenopathy:    He has no cervical adenopathy.  Neurological: He is alert and oriented to person, place, and time. He has normal reflexes. No cranial nerve deficit. He exhibits normal muscle tone. He displays a negative Romberg sign. Coordination and gait normal.  Skin: Skin is warm and dry. No rash noted.  Psychiatric: He has a normal mood and affect. His behavior is normal. Judgment and thought content normal.  prostate 1+ and R lobe 5 mm nodule Cyst on scalp - large   Procedure: EKG Indication: chest palpitations Impression: NSR. No acute changes.   Lab Results  Component Value Date   WBC 8.7 01/13/2016   HGB 15.9 01/13/2016   HCT 46.9 01/13/2016   PLT 208.0 01/13/2016   GLUCOSE 108 (H) 01/13/2016   CHOL 192 01/12/2015   TRIG 186.0 (H) 01/12/2015   HDL 32.50 (L) 01/12/2015  LDLDIRECT 175.3 01/08/2013   LDLCALC 122 (H) 01/12/2015   ALT 21 01/13/2016   AST 16 01/13/2016   NA 140 01/13/2016   K 3.6 01/13/2016   CL 105 01/13/2016   CREATININE 0.75 01/13/2016   BUN 15 01/13/2016   CO2 26 01/13/2016   TSH 2.03 01/13/2016   PSA 2.60 01/13/2016    Dg Chest 2 View  Result Date: 01/13/2016 CLINICAL DATA:  Routine examination, no current symptoms EXAM: CHEST  2  VIEW COMPARISON:  None in PACs FINDINGS: The lungs are adequately inflated and clear. The heart and pulmonary vascularity are normal. The mediastinum is normal in width. There is no pleural effusion. There is mild multilevel degenerative disc disease of the thoracic spine. IMPRESSION: There is no active cardiopulmonary disease. Electronically Signed   By: David  Martinique M.D.   On: 01/13/2016 16:26   Assessment & Plan:   There are no diagnoses linked to this encounter. I am having Mr. Lauter maintain his Multiple Vitamins-Minerals ([place]), Cholecalciferol, latanoprost, and Propylene Glycol (SYSTANE BALANCE OP).  Meds ordered this encounter  Medications  . latanoprost (XALATAN) 0.005 % ophthalmic solution    Sig: Place 1 drop into both eyes nightly.  Marland Kitchen Propylene Glycol (SYSTANE BALANCE OP)    Sig: Apply to eye.     Follow-up: No Follow-up on file.  Walker Kehr, MD

## 2017-01-25 LAB — PSA, TOTAL AND FREE
PSA, % FREE: 24 % — AB (ref >25)
PSA, FREE: 0.8 ng/mL
PSA, Total: 3.4 ng/mL (ref <=4.0)

## 2017-01-26 NOTE — Telephone Encounter (Signed)
Results mailed 

## 2017-02-07 DIAGNOSIS — D3709 Neoplasm of uncertain behavior of other specified sites of the oral cavity: Secondary | ICD-10-CM | POA: Diagnosis not present

## 2017-02-07 DIAGNOSIS — H903 Sensorineural hearing loss, bilateral: Secondary | ICD-10-CM | POA: Diagnosis not present

## 2017-07-07 DIAGNOSIS — H2513 Age-related nuclear cataract, bilateral: Secondary | ICD-10-CM | POA: Diagnosis not present

## 2017-07-07 DIAGNOSIS — H268 Other specified cataract: Secondary | ICD-10-CM | POA: Diagnosis not present

## 2017-07-07 DIAGNOSIS — H401431 Capsular glaucoma with pseudoexfoliation of lens, bilateral, mild stage: Secondary | ICD-10-CM | POA: Diagnosis not present

## 2017-07-07 DIAGNOSIS — H524 Presbyopia: Secondary | ICD-10-CM | POA: Diagnosis not present

## 2017-07-07 DIAGNOSIS — H11123 Conjunctival concretions, bilateral: Secondary | ICD-10-CM | POA: Diagnosis not present

## 2017-07-07 DIAGNOSIS — H5203 Hypermetropia, bilateral: Secondary | ICD-10-CM | POA: Diagnosis not present

## 2017-08-15 DIAGNOSIS — H401431 Capsular glaucoma with pseudoexfoliation of lens, bilateral, mild stage: Secondary | ICD-10-CM | POA: Diagnosis not present

## 2017-09-28 DIAGNOSIS — H401431 Capsular glaucoma with pseudoexfoliation of lens, bilateral, mild stage: Secondary | ICD-10-CM | POA: Diagnosis not present

## 2017-12-29 DIAGNOSIS — H5203 Hypermetropia, bilateral: Secondary | ICD-10-CM | POA: Diagnosis not present

## 2017-12-29 DIAGNOSIS — H268 Other specified cataract: Secondary | ICD-10-CM | POA: Diagnosis not present

## 2017-12-29 DIAGNOSIS — H401431 Capsular glaucoma with pseudoexfoliation of lens, bilateral, mild stage: Secondary | ICD-10-CM | POA: Diagnosis not present

## 2017-12-29 DIAGNOSIS — H2513 Age-related nuclear cataract, bilateral: Secondary | ICD-10-CM | POA: Diagnosis not present

## 2017-12-29 DIAGNOSIS — H524 Presbyopia: Secondary | ICD-10-CM | POA: Diagnosis not present

## 2018-01-17 DIAGNOSIS — R69 Illness, unspecified: Secondary | ICD-10-CM | POA: Diagnosis not present

## 2018-01-25 ENCOUNTER — Encounter: Payer: Self-pay | Admitting: Internal Medicine

## 2018-01-25 ENCOUNTER — Other Ambulatory Visit (INDEPENDENT_AMBULATORY_CARE_PROVIDER_SITE_OTHER): Payer: Medicare HMO

## 2018-01-25 ENCOUNTER — Ambulatory Visit (INDEPENDENT_AMBULATORY_CARE_PROVIDER_SITE_OTHER): Payer: Medicare HMO | Admitting: Internal Medicine

## 2018-01-25 VITALS — BP 132/84 | HR 67 | Temp 98.0°F | Ht 66.0 in | Wt 171.0 lb

## 2018-01-25 DIAGNOSIS — E785 Hyperlipidemia, unspecified: Secondary | ICD-10-CM

## 2018-01-25 DIAGNOSIS — L723 Sebaceous cyst: Secondary | ICD-10-CM

## 2018-01-25 DIAGNOSIS — Z0000 Encounter for general adult medical examination without abnormal findings: Secondary | ICD-10-CM | POA: Diagnosis not present

## 2018-01-25 DIAGNOSIS — N320 Bladder-neck obstruction: Secondary | ICD-10-CM

## 2018-01-25 DIAGNOSIS — R002 Palpitations: Secondary | ICD-10-CM

## 2018-01-25 DIAGNOSIS — K130 Diseases of lips: Secondary | ICD-10-CM

## 2018-01-25 LAB — URINALYSIS
BILIRUBIN URINE: NEGATIVE
HGB URINE DIPSTICK: NEGATIVE
KETONES UR: NEGATIVE
Leukocytes, UA: NEGATIVE
Nitrite: NEGATIVE
Specific Gravity, Urine: 1.02 (ref 1.000–1.030)
UROBILINOGEN UA: 0.2 (ref 0.0–1.0)
Urine Glucose: NEGATIVE
pH: 6.5 (ref 5.0–8.0)

## 2018-01-25 LAB — BASIC METABOLIC PANEL
BUN: 18 mg/dL (ref 6–23)
CHLORIDE: 104 meq/L (ref 96–112)
CO2: 26 meq/L (ref 19–32)
Calcium: 9.1 mg/dL (ref 8.4–10.5)
Creatinine, Ser: 0.76 mg/dL (ref 0.40–1.50)
GFR: 107.61 mL/min (ref >60.00)
Glucose, Bld: 114 mg/dL — ABNORMAL HIGH (ref 70–99)
Potassium: 4 mEq/L (ref 3.5–5.1)
Sodium: 138 mEq/L (ref 135–145)

## 2018-01-25 LAB — CBC WITH DIFFERENTIAL/PLATELET
BASOS ABS: 0.1 10*3/uL (ref 0.0–0.1)
Basophils Relative: 0.5 % (ref 0.0–3.0)
EOS ABS: 0.2 10*3/uL (ref 0.0–0.7)
Eosinophils Relative: 1.7 % (ref 0.0–5.0)
HEMATOCRIT: 45.2 % (ref 39.0–52.0)
Hemoglobin: 15.4 g/dL (ref 13.0–17.0)
Lymphocytes Relative: 21.4 % (ref 12.0–46.0)
Lymphs Abs: 2.1 10*3/uL (ref 0.7–4.0)
MCHC: 34.2 g/dL (ref 30.0–36.0)
MCV: 82.3 fl (ref 78.0–100.0)
MONO ABS: 0.7 10*3/uL (ref 0.1–1.0)
Monocytes Relative: 7.2 % (ref 3.0–12.0)
NEUTROS ABS: 6.7 10*3/uL (ref 1.4–7.7)
NEUTROS PCT: 69.2 % (ref 43.0–77.0)
PLATELETS: 209 10*3/uL (ref 150.0–400.0)
RBC: 5.49 Mil/uL (ref 4.22–5.81)
RDW: 15.4 % (ref 11.5–15.5)
WBC: 9.7 10*3/uL (ref 4.0–10.5)

## 2018-01-25 LAB — HEPATIC FUNCTION PANEL
ALBUMIN: 4.1 g/dL (ref 3.5–5.2)
ALK PHOS: 62 U/L (ref 39–117)
ALT: 14 U/L (ref 0–53)
AST: 11 U/L (ref 0–37)
Bilirubin, Direct: 0.1 mg/dL (ref 0.0–0.3)
TOTAL PROTEIN: 7 g/dL (ref 6.0–8.3)
Total Bilirubin: 0.7 mg/dL (ref 0.2–1.2)

## 2018-01-25 LAB — LIPID PANEL
CHOL/HDL RATIO: 6
Cholesterol: 215 mg/dL — ABNORMAL HIGH (ref 0–200)
HDL: 38 mg/dL — ABNORMAL LOW (ref >39.00)
LDL CALC: 151 mg/dL — AB (ref 0–99)
NonHDL: 176.64
TRIGLYCERIDES: 130 mg/dL (ref 0.0–149.0)
VLDL: 26 mg/dL (ref 0.0–40.0)

## 2018-01-25 LAB — SEDIMENTATION RATE: SED RATE: 63 mm/h — AB (ref 0–20)

## 2018-01-25 LAB — TSH: TSH: 3.96 u[IU]/mL (ref 0.35–4.50)

## 2018-01-25 LAB — PSA: PSA: 2.44 ng/mL (ref 0.10–4.00)

## 2018-01-25 NOTE — Assessment & Plan Note (Signed)
EKG

## 2018-01-25 NOTE — Progress Notes (Addendum)
Subjective:  Patient ID: Danny Gutierrez, male    DOB: [birth year]  Age: 72 y.o. MRN: [id number]  CC: No chief complaint on file.   HPI Kylo Lazcano presents for a well exam C/o lip lesion C/o cyst on scalp  Outpatient Medications Prior to Visit  Medication Sig Dispense Refill  . Ascorbic Acid (SM VITAMIN C) 500 MG CHEW Chew by mouth.    . Cholecalciferol 1000 UNITS tablet Take 1,000 Units by mouth daily.    . dorzolamide-timolol (COSOPT) 22.3-6.8 MG/ML ophthalmic solution INT 1 GTT IN OU BID  6  . latanoprost (XALATAN) 0.005 % ophthalmic solution Place 1 drop into both eyes nightly.    . Multiple Minerals-Vitamins (CALCIUM-MAGNESIUM-ZINC-D3 PO) Take by mouth.    . Multiple Vitamins-Minerals (CENTRUM SILVER ADULT 50+ PO) Take by mouth.    . Multiple Vitamins-Minerals (OCUVITE EYE HEALTH FORMULA PO) Take by mouth daily.    . Multiple Vitamins-Minerals (ONE-A-DAY PROACTIVE 65+ PO) Take by mouth.    Marland Kitchen OVER THE COUNTER MEDICATION Focus Factor    . Pollen Extracts (PROSTAT PO) Take by mouth. prostagenix    . Propylene Glycol (SYSTANE BALANCE OP) Apply to eye.    . SUPER B COMPLEX/C PO Take by mouth.     No facility-administered medications prior to visit.     ROS: Review of Systems  Constitutional: Negative for appetite change, fatigue and unexpected weight change.  HENT: Negative for congestion, nosebleeds, sneezing, sore throat and trouble swallowing.   Eyes: Negative for itching and visual disturbance.  Respiratory: Negative for cough.   Cardiovascular: Positive for palpitations. Negative for chest pain and leg swelling.  Gastrointestinal: Negative for abdominal distention, blood in stool, diarrhea and nausea.  Genitourinary: Negative for frequency and hematuria.  Musculoskeletal: Negative for back pain, gait problem, joint swelling and neck pain.  Skin: Negative for rash.  Neurological: Negative for dizziness, tremors, speech difficulty and weakness.    Psychiatric/Behavioral: Negative for agitation, dysphoric mood, sleep disturbance and suicidal ideas. The patient is not nervous/anxious.     Objective:  BP 132/84 (BP Location: Left Arm, Patient Position: Sitting, Cuff Size: Normal)   Pulse 67   Temp 98 F (36.7 C) (Oral)   Ht 5\' 6"  (1.676 m)   Wt 171 lb (77.6 kg)   SpO2 97%   BMI 27.60 kg/m   BP Readings from Last 3 Encounters:  01/25/18 132/84  01/24/17 128/86  01/13/16 124/80    Wt Readings from Last 3 Encounters:  01/25/18 171 lb (77.6 kg)  01/24/17 178 lb (80.7 kg)  01/13/16 176 lb (79.8 kg)    Physical Exam  Constitutional: He is oriented to person, place, and time. He appears well-developed. No distress.  NAD  HENT:  Mouth/Throat: Oropharynx is clear and moist.  Eyes: Pupils are equal, round, and reactive to light. Conjunctivae are normal.  Neck: Normal range of motion. No JVD present. No thyromegaly present.  Cardiovascular: Normal rate, regular rhythm, normal heart sounds and intact distal pulses. Exam reveals no gallop and no friction rub.  No murmur heard. Pulmonary/Chest: Effort normal and breath sounds normal. No respiratory distress. He has no wheezes. He has no rales. He exhibits no tenderness.  Abdominal: Soft. Bowel sounds are normal. He exhibits no distension and no mass. There is no tenderness. There is no rebound and no guarding.  Musculoskeletal: Normal range of motion. He exhibits no edema or tenderness.  Lymphadenopathy:    He has no cervical adenopathy.  Neurological: He is alert and oriented  to person, place, and time. He has normal reflexes. No cranial nerve deficit. He exhibits normal muscle tone. He displays a negative Romberg sign. Coordination and gait normal.  Skin: Skin is warm and dry. No rash noted.  Psychiatric: He has a normal mood and affect. His behavior is normal. Judgment and thought content normal.  growth on the scalp - large Prostate 1+ Lip lesion - flat purple,  small Procedure: EKG Indication: palpitations Impression: NSR. No acute changes.   Lab Results  Component Value Date   WBC 7.6 01/24/2017   HGB 16.3 01/24/2017   HCT 48.3 01/24/2017   PLT 183.0 01/24/2017   GLUCOSE 105 (H) 01/24/2017   CHOL 224 (H) 01/24/2017   TRIG 192.0 (H) 01/24/2017   HDL 40.30 01/24/2017   LDLDIRECT 175.3 01/08/2013   LDLCALC 146 (H) 01/24/2017   ALT 13 01/24/2017   AST 12 01/24/2017   NA 137 01/24/2017   K 3.6 01/24/2017   CL 103 01/24/2017   CREATININE 0.81 01/24/2017   BUN 16 01/24/2017   CO2 26 01/24/2017   TSH 2.98 01/24/2017   PSA 2.60 01/13/2016    Dg Chest 2 View  Result Date: 01/13/2016 CLINICAL DATA:  Routine examination, no current symptoms EXAM: CHEST  2 VIEW COMPARISON:  None in PACs FINDINGS: The lungs are adequately inflated and clear. The heart and pulmonary vascularity are normal. The mediastinum is normal in width. There is no pleural effusion. There is mild multilevel degenerative disc disease of the thoracic spine. IMPRESSION: There is no active cardiopulmonary disease. Electronically Signed   By: David  Martinique M.D.   On: 01/13/2016 16:26   Assessment & Plan:   There are no diagnoses linked to this encounter.   No orders of the defined types were placed in this encounter.    Follow-up: No follow-ups on file.  Walker Kehr, MD

## 2018-01-25 NOTE — Assessment & Plan Note (Addendum)
Here for medicare wellness/physical  Diet: heart healthy  Physical activity: not sedentary  Depression/mood screen: negative  Hearing: intact to whispered voice  Visual acuity: grossly normal, performs annual eye exam  ADLs: capable  Fall risk: none  Home safety: good  Cognitive evaluation: intact to orientation, naming, recall and repetition  EOL planning: adv directives, full code/ I agree  I have personally reviewed and have noted  1. The patient's medical and social history  2. Their use of alcohol, tobacco or illicit drugs  3. Their current medications and supplements  4. The patient's functional ability including ADL's, fall risks, home safety risks and hearing or visual impairment.  5. Diet and physical activities  6. Evidence for depression or mood disorders 7. Roster of providers is reviewed    Today patient counseled on age appropriate routine health concerns for screening and prevention, each reviewed and up to date or declined. Immunizations reviewed and up to date or declined. Labs ordered and reviewed. Risk factors for depression reviewed and negative. Hearing function and visual acuity are intact. ADLs screened and addressed as needed. Functional ability and level of safety reviewed and appropriate. Education, counseling and referrals performed based on assessed risks today. Patient provided with a copy of personalized plan for preventive services.   Cologuard to sch Declined Shingrix

## 2018-01-25 NOTE — Assessment & Plan Note (Signed)
Derm appt

## 2018-01-25 NOTE — Patient Instructions (Signed)

## 2018-01-25 NOTE — Assessment & Plan Note (Signed)
Derm ref - pt will make his own appt

## 2018-02-06 DIAGNOSIS — H903 Sensorineural hearing loss, bilateral: Secondary | ICD-10-CM | POA: Diagnosis not present

## 2018-02-06 DIAGNOSIS — D3709 Neoplasm of uncertain behavior of other specified sites of the oral cavity: Secondary | ICD-10-CM | POA: Diagnosis not present

## 2018-03-20 DIAGNOSIS — Z1211 Encounter for screening for malignant neoplasm of colon: Secondary | ICD-10-CM | POA: Diagnosis not present

## 2018-03-20 DIAGNOSIS — Z1212 Encounter for screening for malignant neoplasm of rectum: Secondary | ICD-10-CM | POA: Diagnosis not present

## 2018-03-24 LAB — COLOGUARD: COLOGUARD: NEGATIVE

## 2018-04-09 ENCOUNTER — Encounter: Payer: Self-pay | Admitting: Internal Medicine

## 2018-04-16 DIAGNOSIS — R21 Rash and other nonspecific skin eruption: Secondary | ICD-10-CM | POA: Diagnosis not present

## 2018-04-16 DIAGNOSIS — L720 Epidermal cyst: Secondary | ICD-10-CM | POA: Diagnosis not present

## 2018-07-05 DIAGNOSIS — H268 Other specified cataract: Secondary | ICD-10-CM | POA: Diagnosis not present

## 2018-07-05 DIAGNOSIS — H52201 Unspecified astigmatism, right eye: Secondary | ICD-10-CM | POA: Diagnosis not present

## 2018-07-05 DIAGNOSIS — H524 Presbyopia: Secondary | ICD-10-CM | POA: Diagnosis not present

## 2018-07-05 DIAGNOSIS — H2513 Age-related nuclear cataract, bilateral: Secondary | ICD-10-CM | POA: Diagnosis not present

## 2018-07-05 DIAGNOSIS — H5203 Hypermetropia, bilateral: Secondary | ICD-10-CM | POA: Diagnosis not present

## 2018-07-05 DIAGNOSIS — H401431 Capsular glaucoma with pseudoexfoliation of lens, bilateral, mild stage: Secondary | ICD-10-CM | POA: Diagnosis not present

## 2018-07-17 DIAGNOSIS — L720 Epidermal cyst: Secondary | ICD-10-CM | POA: Diagnosis not present

## 2018-07-25 DIAGNOSIS — L720 Epidermal cyst: Secondary | ICD-10-CM | POA: Diagnosis not present

## 2019-01-04 DIAGNOSIS — H268 Other specified cataract: Secondary | ICD-10-CM | POA: Diagnosis not present

## 2019-01-04 DIAGNOSIS — H401431 Capsular glaucoma with pseudoexfoliation of lens, bilateral, mild stage: Secondary | ICD-10-CM | POA: Diagnosis not present

## 2019-01-29 ENCOUNTER — Encounter: Payer: Medicare HMO | Admitting: Internal Medicine

## 2019-02-05 ENCOUNTER — Other Ambulatory Visit: Payer: Self-pay

## 2019-02-05 ENCOUNTER — Encounter: Payer: Self-pay | Admitting: Internal Medicine

## 2019-02-05 ENCOUNTER — Other Ambulatory Visit (INDEPENDENT_AMBULATORY_CARE_PROVIDER_SITE_OTHER): Payer: PPO

## 2019-02-05 ENCOUNTER — Ambulatory Visit (INDEPENDENT_AMBULATORY_CARE_PROVIDER_SITE_OTHER): Payer: PPO | Admitting: Internal Medicine

## 2019-02-05 VITALS — BP 160/90 | HR 80 | Temp 98.8°F | Ht 66.0 in | Wt 172.0 lb

## 2019-02-05 DIAGNOSIS — N402 Nodular prostate without lower urinary tract symptoms: Secondary | ICD-10-CM

## 2019-02-05 DIAGNOSIS — R002 Palpitations: Secondary | ICD-10-CM | POA: Diagnosis not present

## 2019-02-05 DIAGNOSIS — N320 Bladder-neck obstruction: Secondary | ICD-10-CM

## 2019-02-05 DIAGNOSIS — Z0000 Encounter for general adult medical examination without abnormal findings: Secondary | ICD-10-CM

## 2019-02-05 DIAGNOSIS — L723 Sebaceous cyst: Secondary | ICD-10-CM | POA: Diagnosis not present

## 2019-02-05 DIAGNOSIS — E785 Hyperlipidemia, unspecified: Secondary | ICD-10-CM

## 2019-02-05 DIAGNOSIS — R030 Elevated blood-pressure reading, without diagnosis of hypertension: Secondary | ICD-10-CM | POA: Insufficient documentation

## 2019-02-05 LAB — CBC WITH DIFFERENTIAL/PLATELET
Basophils Absolute: 0 10*3/uL (ref 0.0–0.1)
Basophils Relative: 0.4 % (ref 0.0–3.0)
Eosinophils Absolute: 0.2 10*3/uL (ref 0.0–0.7)
Eosinophils Relative: 2 % (ref 0.0–5.0)
HCT: 45.5 % (ref 39.0–52.0)
Hemoglobin: 15.4 g/dL (ref 13.0–17.0)
Lymphocytes Relative: 22.7 % (ref 12.0–46.0)
Lymphs Abs: 2 10*3/uL (ref 0.7–4.0)
MCHC: 33.8 g/dL (ref 30.0–36.0)
MCV: 84.3 fl (ref 78.0–100.0)
Monocytes Absolute: 0.6 10*3/uL (ref 0.1–1.0)
Monocytes Relative: 6.6 % (ref 3.0–12.0)
Neutro Abs: 6.2 10*3/uL (ref 1.4–7.7)
Neutrophils Relative %: 68.3 % (ref 43.0–77.0)
Platelets: 221 10*3/uL (ref 150.0–400.0)
RBC: 5.4 Mil/uL (ref 4.22–5.81)
RDW: 14.9 % (ref 11.5–15.5)
WBC: 9 10*3/uL (ref 4.0–10.5)

## 2019-02-05 LAB — BASIC METABOLIC PANEL
BUN: 14 mg/dL (ref 6–23)
CO2: 26 mEq/L (ref 19–32)
Calcium: 9.6 mg/dL (ref 8.4–10.5)
Chloride: 105 mEq/L (ref 96–112)
Creatinine, Ser: 0.72 mg/dL (ref 0.40–1.50)
GFR: 107.45 mL/min (ref >60.00)
Glucose, Bld: 110 mg/dL — ABNORMAL HIGH (ref 70–99)
Potassium: 4.1 mEq/L (ref 3.5–5.1)
Sodium: 139 mEq/L (ref 135–145)

## 2019-02-05 LAB — URINALYSIS
Bilirubin Urine: NEGATIVE
Hgb urine dipstick: NEGATIVE
Ketones, ur: NEGATIVE
Leukocytes,Ua: NEGATIVE
Nitrite: NEGATIVE
Specific Gravity, Urine: 1.025 (ref 1.000–1.030)
Total Protein, Urine: NEGATIVE
Urine Glucose: NEGATIVE
Urobilinogen, UA: 0.2 (ref 0.0–1.0)
pH: 5 (ref 5.0–8.0)

## 2019-02-05 LAB — TSH: TSH: 3.09 u[IU]/mL (ref 0.35–4.50)

## 2019-02-05 LAB — HEPATIC FUNCTION PANEL
ALT: 18 U/L (ref 0–53)
AST: 15 U/L (ref 0–37)
Albumin: 4.3 g/dL (ref 3.5–5.2)
Alkaline Phosphatase: 57 U/L (ref 39–117)
Bilirubin, Direct: 0.1 mg/dL (ref 0.0–0.3)
Total Bilirubin: 0.7 mg/dL (ref 0.2–1.2)
Total Protein: 7.1 g/dL (ref 6.0–8.3)

## 2019-02-05 LAB — LDL CHOLESTEROL, DIRECT: Direct LDL: 165 mg/dL

## 2019-02-05 LAB — PSA: PSA: 2.19 ng/mL (ref 0.10–4.00)

## 2019-02-05 LAB — SEDIMENTATION RATE: Sed Rate: 31 mm/hr — ABNORMAL HIGH (ref 0–20)

## 2019-02-05 LAB — LIPID PANEL
Cholesterol: 237 mg/dL — ABNORMAL HIGH (ref 0–200)
HDL: 38.1 mg/dL — ABNORMAL LOW (ref >39.00)
NonHDL: 198.74
Total CHOL/HDL Ratio: 6
Triglycerides: 218 mg/dL — ABNORMAL HIGH (ref 0.0–149.0)
VLDL: 43.6 mg/dL — ABNORMAL HIGH (ref 0.0–40.0)

## 2019-02-05 MED ORDER — SHINGRIX 50 MCG/0.5ML IM SUSR
0.5000 mL | Freq: Once | INTRAMUSCULAR | 1 refills | Status: AC
Start: 2019-02-05 — End: 2019-02-05

## 2019-02-05 NOTE — Progress Notes (Signed)
Subjective:  Patient ID: Danny Gutierrez, male    DOB: [birth year]  Age: 73 y.o. MRN: [id number]  CC: No chief complaint on file.   HPI Danny Gutierrez presents for elevated BP - new C/o occ palpitations F/u BPH   Outpatient Medications Prior to Visit  Medication Sig Dispense Refill  . Ascorbic Acid (SM VITAMIN C) 500 MG CHEW Chew by mouth.    . Cholecalciferol 1000 UNITS tablet Take 1,000 Units by mouth daily.    . dorzolamide-timolol (COSOPT) 22.3-6.8 MG/ML ophthalmic solution INT 1 GTT IN OU BID  6  . Multiple Minerals-Vitamins (CALCIUM-MAGNESIUM-ZINC-D3 PO) Take by mouth.    . Multiple Vitamins-Minerals (CENTRUM SILVER ADULT 50+ PO) Take by mouth.    . Multiple Vitamins-Minerals (OCUVITE EYE HEALTH FORMULA PO) Take by mouth daily.    . Multiple Vitamins-Minerals (ONE-A-DAY PROACTIVE 65+ PO) Take by mouth.    Marland Kitchen OVER THE COUNTER MEDICATION Focus Factor    . Pollen Extracts (PROSTAT PO) Take by mouth. prostagenix    . SUPER B COMPLEX/C PO Take by mouth.    . latanoprost (XALATAN) 0.005 % ophthalmic solution Place 1 drop into both eyes nightly.    Marland Kitchen Propylene Glycol (SYSTANE BALANCE OP) Apply to eye.     No facility-administered medications prior to visit.     ROS: Review of Systems  Constitutional: Negative for appetite change, fatigue and unexpected weight change.  HENT: Negative for congestion, nosebleeds, sneezing, sore throat and trouble swallowing.   Eyes: Negative for itching and visual disturbance.  Respiratory: Negative for cough and shortness of breath.   Cardiovascular: Positive for palpitations. Negative for chest pain and leg swelling.  Gastrointestinal: Negative for abdominal distention, blood in stool, diarrhea and nausea.  Genitourinary: Positive for urgency. Negative for frequency and hematuria.  Musculoskeletal: Negative for back pain, gait problem, joint swelling and neck pain.  Skin: Negative for rash.  Neurological: Negative for dizziness,  tremors, speech difficulty and weakness.  Psychiatric/Behavioral: Negative for agitation, behavioral problems, decreased concentration, dysphoric mood, sleep disturbance and suicidal ideas. The patient is not nervous/anxious.     Objective:  BP (!) 160/90   Pulse 80   Temp 98.8 F (37.1 C) (Oral)   Ht 5\' 6"  (1.676 m)   Wt 172 lb (78 kg)   SpO2 96%   BMI 27.76 kg/m   BP Readings from Last 3 Encounters:  02/05/19 (!) 160/90  01/25/18 132/84  01/24/17 128/86    Wt Readings from Last 3 Encounters:  02/05/19 172 lb (78 kg)  01/25/18 171 lb (77.6 kg)  01/24/17 178 lb (80.7 kg)    Physical Exam Constitutional:      General: He is not in acute distress.    Appearance: He is well-developed.     Comments: NAD  Eyes:     Conjunctiva/sclera: Conjunctivae normal.     Pupils: Pupils are equal, round, and reactive to light.  Neck:     Musculoskeletal: Normal range of motion.     Thyroid: No thyromegaly.     Vascular: No JVD.  Cardiovascular:     Rate and Rhythm: Normal rate and regular rhythm.     Heart sounds: Normal heart sounds. No murmur. No friction rub. No gallop.   Pulmonary:     Effort: Pulmonary effort is normal. No respiratory distress.     Breath sounds: Normal breath sounds. No wheezing or rales.  Chest:     Chest wall: No tenderness.  Abdominal:     General: Bowel sounds  are normal. There is no distension.     Palpations: Abdomen is soft. There is no mass.     Tenderness: There is no abdominal tenderness. There is no guarding or rebound.  Musculoskeletal: Normal range of motion.        General: No tenderness.  Lymphadenopathy:     Cervical: No cervical adenopathy.  Skin:    General: Skin is warm and dry.     Findings: No rash.  Neurological:     Mental Status: He is alert and oriented to person, place, and time.     Cranial Nerves: No cranial nerve deficit.     Motor: No abnormal muscle tone.     Coordination: Coordination normal.     Gait: Gait normal.      Deep Tendon Reflexes: Reflexes are normal and symmetric.  Psychiatric:        Behavior: Behavior normal.        Thought Content: Thought content normal.        Judgment: Judgment normal.   Procedure: EKG Indication: palpitations Impression: NSR. No acute/new changes.   Lab Results  Component Value Date   WBC 9.7 01/25/2018   HGB 15.4 01/25/2018   HCT 45.2 01/25/2018   PLT 209.0 01/25/2018   GLUCOSE 114 (H) 01/25/2018   CHOL 215 (H) 01/25/2018   TRIG 130.0 01/25/2018   HDL 38.00 (L) 01/25/2018   LDLDIRECT 175.3 01/08/2013   LDLCALC 151 (H) 01/25/2018   ALT 14 01/25/2018   AST 11 01/25/2018   NA 138 01/25/2018   K 4.0 01/25/2018   CL 104 01/25/2018   CREATININE 0.76 01/25/2018   BUN 18 01/25/2018   CO2 26 01/25/2018   TSH 3.96 01/25/2018   PSA 2.44 01/25/2018    Dg Chest 2 View  Result Date: 01/13/2016 CLINICAL DATA:  Routine examination, no current symptoms EXAM: CHEST  2 VIEW COMPARISON:  None in PACs FINDINGS: The lungs are adequately inflated and clear. The heart and pulmonary vascularity are normal. The mediastinum is normal in width. There is no pleural effusion. There is mild multilevel degenerative disc disease of the thoracic spine. IMPRESSION: There is no active cardiopulmonary disease. Electronically Signed   By: David  Martinique M.D.   On: 01/13/2016 16:26   Assessment & Plan:   There are no diagnoses linked to this encounter.   No orders of the defined types were placed in this encounter.    Follow-up: No follow-ups on file.  Walker Kehr, MD

## 2019-02-05 NOTE — Patient Instructions (Addendum)
If you have medicare related insurance (such as traditional Medicare, [employer], [employer], or similar), Please make an appointment at the scheduling desk with Sharee Pimple, the [employer], for your Wellness visit in this office, which is a benefit with your insurance.     These suggestions will probably help you to improve your metabolism if you are not overweight and to lose weight if you are overweight: 1.  Reduce your consumption of sugars and starches.  Eliminate high fructose corn syrup from your diet.  Reduce your consumption of processed foods.  For desserts try to have seasonal fruits, berries, nuts, cheeses or dark chocolate with more than 70% cacao. 2.  Do not snack 3.  You do not have to eat breakfast.  If you choose to have breakfast-eat plain greek yogurt, eggs, oatmeal (without sugar) 4.  Drink water, freshly brewed unsweetened tea (green, black or herbal) or coffee.  Do not drink sodas including diet sodas , juices, beverages sweetened with artificial sweeteners. 5.  Reduce your consumption of refined grains. 6.  Avoid protein drinks such as Optifast, Slim fast etc. Eat chicken, fish, meat, dairy and beans for your sources of protein 7.  Natural unprocessed fats like cold pressed virgin olive oil, butter, coconut oil are good for you.  Eat avocados 8.  Increase your consumption of fiber.  Fruits, berries, vegetables, whole grains, flaxseeds, Chia seeds, beans, popcorn, nuts, oatmeal are good sources of fiber 9.  Use vinegar in your diet, i.e. apple cider vinegar, red wine or balsamic vinegar 10.  You can try fasting.  For example you can skip breakfast and lunch every other day (24-hour fast) 11.  Stress reduction, good night sleep, relaxation, meditation, yoga and other physical activity is likely to help you to maintain low weight too. 12.  If you drink alcohol, limit your alcohol intake to no more than 2 drinks a day.   Mediterranean diet is good  for you. ([employer] has a typical Mediterranean cuisine menu) The Mediterranean diet is a way of eating based on the traditional cuisine of countries bordering the [employer]. While there is no single definition of the Mediterranean diet, it is typically high in vegetables, fruits, whole grains, beans, nut and seeds, and olive oil. The main components of Mediterranean diet include: Marland Kitchen Daily consumption of vegetables, fruits, whole grains and healthy fats  . Weekly intake of fish, poultry, beans and eggs  . Moderate portions of dairy products  . Limited intake of red meat Other important elements of the Mediterranean diet are sharing meals with family and friends, enjoying a glass of red wine and being physically active. Health benefits of a Mediterranean diet: A traditional Mediterranean diet consisting of large quantities of fresh fruits and vegetables, nuts, fish and olive oil-coupled with physical activity-can reduce your risk of serious mental and physical health problems by: Preventing heart disease and strokes. Following a Mediterranean diet limits your intake of refined breads, processed foods, and red meat, and encourages drinking red wine instead of hard liquor-all factors that can help prevent heart disease and stroke. Keeping you agile. If you're an older adult, the nutrients gained with a Mediterranean diet may reduce your risk of developing muscle weakness and other signs of frailty by about 70 percent. Reducing the risk of Alzheimer's. Research suggests that the [place] diet may improve cholesterol, blood sugar levels, and overall blood vessel health, which in turn may reduce your risk of Alzheimer's disease or dementia. Halving  the risk of Parkinson's disease. The high levels of antioxidants in the Mediterranean diet can prevent cells from undergoing a damaging process called oxidative stress, thereby cutting the risk of Parkinson's disease in half. Increasing longevity.  By reducing your risk of developing heart disease or cancer with the Mediterranean diet, you're reducing your risk of death at any age by 20%. Protecting against type 2 diabetes. A Mediterranean diet is rich in fiber which digests slowly, prevents huge swings in blood sugar, and can help you maintain a healthy weight.    Cabbage soup recipe that will not make you gain weight: Take 1 small head of cabbage, 1 average pack of celery, 4 green peppers, 4 onions, 2 cans diced tomatoes (they are not available without salt), salt and spices to taste.  Chop cabbage, celery, peppers and onions.  And tomatoes and 2-2.5 liters (2.5 quarts) of water so that it would just cover the vegetables.  Bring to boil.  Add spices and salt.  Turn heat to low/medium and simmer for 20-25 minutes.  Naturally, you can make a smaller batch and change some of the ingredients.    Cardiac CT calcium scoring test $150   Computed tomography, more commonly known as a CT or CAT scan, is a diagnostic medical imaging test. Like traditional x-rays, it produces multiple images or pictures of the inside of the body. The cross-sectional images generated during a CT scan can be reformatted in multiple planes. They can even generate three-dimensional images. These images can be viewed on a computer monitor, printed on film or by a 3D printer, or transferred to a CD or DVD. CT images of internal organs, bones, soft tissue and blood vessels provide greater detail than traditional x-rays, particularly of soft tissues and blood vessels. A cardiac CT scan for coronary calcium is a non-invasive way of obtaining information about the presence, location and extent of calcified plaque in the coronary arteries-the vessels that supply oxygen-containing blood to the heart muscle. Calcified plaque results when there is a build-up of fat and other substances under the inner layer of the artery. This material can calcify which signals the presence of  atherosclerosis, a disease of the vessel wall, also called coronary artery disease (CAD). People with this disease have an increased risk for heart attacks. In addition, over time, progression of plaque build up (CAD) can narrow the arteries or even close off blood flow to the heart. The result may be chest pain, sometimes called "angina," or a heart attack. Because calcium is a marker of CAD, the amount of calcium detected on a cardiac CT scan is a helpful prognostic tool. The findings on cardiac CT are expressed as a calcium score. Another name for this test is coronary artery calcium scoring.  What are some common uses of the procedure? The goal of cardiac CT scan for calcium scoring is to determine if CAD is present and to what extent, even if there are no symptoms. It is a screening study that may be recommended by a physician for patients with risk factors for CAD but no clinical symptoms. The major risk factors for CAD are: . high blood cholesterol levels  . family history of heart attacks  . diabetes  . high blood pressure  . cigarette smoking  . overweight or obese  . physical inactivity   A negative cardiac CT scan for calcium scoring shows no calcification within the coronary arteries. This suggests that CAD is absent or so minimal it cannot be  seen by this technique. The chance of having a heart attack over the next two to five years is very low under these circumstances. A positive test means that CAD is present, regardless of whether or not the patient is experiencing any symptoms. The amount of calcification-expressed as the calcium score-may help to predict the likelihood of a myocardial infarction (heart attack) in the coming years and helps your medical doctor or cardiologist decide whether the patient may need to take preventive medicine or undertake other measures such as diet and exercise to lower the risk for heart attack. The extent of CAD is graded according to your calcium  score:  Calcium Score  Presence of CAD (coronary artery disease)  0 No evidence of CAD   1-10 Minimal evidence of CAD  11-100 Mild evidence of CAD  101-400 Moderate evidence of CAD  Over 400 Extensive evidence of CAD

## 2019-02-05 NOTE — Assessment & Plan Note (Signed)
removed 2020

## 2019-02-05 NOTE — Assessment & Plan Note (Signed)
PSA

## 2019-02-05 NOTE — Assessment & Plan Note (Signed)
check BP at home

## 2019-02-05 NOTE — Assessment & Plan Note (Addendum)
EKG [] Labs []

## 2019-02-05 NOTE — Assessment & Plan Note (Signed)
OV w/Jill  Cologuard 2019 Declined Shingrix

## 2019-02-08 ENCOUNTER — Encounter: Payer: Self-pay | Admitting: *Deleted

## 2019-04-10 DIAGNOSIS — H903 Sensorineural hearing loss, bilateral: Secondary | ICD-10-CM | POA: Diagnosis not present

## 2019-04-19 ENCOUNTER — Other Ambulatory Visit: Payer: Self-pay

## 2019-04-19 ENCOUNTER — Ambulatory Visit (INDEPENDENT_AMBULATORY_CARE_PROVIDER_SITE_OTHER): Payer: PPO

## 2019-04-19 DIAGNOSIS — Z23 Encounter for immunization: Secondary | ICD-10-CM | POA: Diagnosis not present

## 2019-07-12 DIAGNOSIS — H2513 Age-related nuclear cataract, bilateral: Secondary | ICD-10-CM | POA: Diagnosis not present

## 2019-07-12 DIAGNOSIS — H5203 Hypermetropia, bilateral: Secondary | ICD-10-CM | POA: Diagnosis not present

## 2019-07-12 DIAGNOSIS — H401431 Capsular glaucoma with pseudoexfoliation of lens, bilateral, mild stage: Secondary | ICD-10-CM | POA: Diagnosis not present

## 2019-07-12 DIAGNOSIS — H52203 Unspecified astigmatism, bilateral: Secondary | ICD-10-CM | POA: Diagnosis not present

## 2019-07-12 DIAGNOSIS — H268 Other specified cataract: Secondary | ICD-10-CM | POA: Diagnosis not present

## 2019-07-12 DIAGNOSIS — H524 Presbyopia: Secondary | ICD-10-CM | POA: Diagnosis not present

## 2020-01-10 DIAGNOSIS — H524 Presbyopia: Secondary | ICD-10-CM | POA: Diagnosis not present

## 2020-01-10 DIAGNOSIS — H5203 Hypermetropia, bilateral: Secondary | ICD-10-CM | POA: Diagnosis not present

## 2020-01-10 DIAGNOSIS — H401431 Capsular glaucoma with pseudoexfoliation of lens, bilateral, mild stage: Secondary | ICD-10-CM | POA: Diagnosis not present

## 2020-01-10 DIAGNOSIS — H268 Other specified cataract: Secondary | ICD-10-CM | POA: Diagnosis not present

## 2020-01-10 DIAGNOSIS — H2513 Age-related nuclear cataract, bilateral: Secondary | ICD-10-CM | POA: Diagnosis not present

## 2020-02-06 ENCOUNTER — Other Ambulatory Visit: Payer: Self-pay

## 2020-02-06 ENCOUNTER — Encounter: Payer: Self-pay | Admitting: Internal Medicine

## 2020-02-06 ENCOUNTER — Ambulatory Visit (INDEPENDENT_AMBULATORY_CARE_PROVIDER_SITE_OTHER): Payer: PPO | Admitting: Internal Medicine

## 2020-02-06 VITALS — BP 126/76 | HR 71 | Temp 98.5°F | Ht 66.0 in | Wt 167.0 lb

## 2020-02-06 DIAGNOSIS — R002 Palpitations: Secondary | ICD-10-CM

## 2020-02-06 DIAGNOSIS — Z0000 Encounter for general adult medical examination without abnormal findings: Secondary | ICD-10-CM

## 2020-02-06 DIAGNOSIS — N62 Hypertrophy of breast: Secondary | ICD-10-CM

## 2020-02-06 DIAGNOSIS — N402 Nodular prostate without lower urinary tract symptoms: Secondary | ICD-10-CM

## 2020-02-06 LAB — COMPLETE METABOLIC PANEL WITH GFR
AG Ratio: 1.5 (calc) (ref 1.0–2.5)
ALT: 19 U/L (ref 9–46)
AST: 17 U/L (ref 10–35)
Albumin: 4.1 g/dL (ref 3.6–5.1)
Alkaline phosphatase (APISO): 61 U/L (ref 35–144)
BUN: 14 mg/dL (ref 7–25)
CO2: 26 mmol/L (ref 20–32)
Calcium: 9.4 mg/dL (ref 8.6–10.3)
Chloride: 106 mmol/L (ref 98–110)
Creat: 0.73 mg/dL (ref 0.70–1.18)
GFR, Est African American: 107 mL/min/{1.73_m2} (ref >=60)
GFR, Est Non African American: 93 mL/min/{1.73_m2} (ref >=60)
Globulin: 2.7 g/dL (calc) (ref 1.9–3.7)
Glucose, Bld: 114 mg/dL — ABNORMAL HIGH (ref 65–99)
Potassium: 4.7 mmol/L (ref 3.5–5.3)
Sodium: 140 mmol/L (ref 135–146)
Total Bilirubin: 0.6 mg/dL (ref 0.2–1.2)
Total Protein: 6.8 g/dL (ref 6.1–8.1)

## 2020-02-06 NOTE — Addendum Note (Signed)
Addended by: Cresenciano Lick on: 02/06/2020 10:48 AM   Modules accepted: Orders

## 2020-02-06 NOTE — Progress Notes (Signed)
Subjective:  Patient ID: Danny Gutierrez, male    DOB: 1946-01-15  Age: 74 y.o. MRN: [id number]  CC: Annual Exam   HPI Siddhartha Barthelemy presents for c/o breast pain L>R  Outpatient Medications Prior to Visit  Medication Sig Dispense Refill  . Ascorbic Acid (SM VITAMIN C) 500 MG CHEW Chew by mouth.    . Cholecalciferol 1000 UNITS tablet Take 1,000 Units by mouth daily.    . dorzolamide-timolol (COSOPT) 22.3-6.8 MG/ML ophthalmic solution INT 1 GTT IN OU BID  6  . Multiple Minerals-Vitamins (CALCIUM-MAGNESIUM-ZINC-D3 PO) Take by mouth.    . Multiple Vitamins-Minerals (CENTRUM SILVER ADULT 50+ PO) Take by mouth.    . Multiple Vitamins-Minerals (OCUVITE EYE HEALTH FORMULA PO) Take by mouth daily.    . Multiple Vitamins-Minerals (ONE-A-DAY PROACTIVE 65+ PO) Take by mouth.    Marland Kitchen OVER THE COUNTER MEDICATION Focus Factor    . Pollen Extracts (PROSTAT PO) Take by mouth. prostagenix    . SUPER B COMPLEX/C PO Take by mouth.     No facility-administered medications prior to visit.    ROS: Review of Systems  Constitutional: Negative for appetite change, fatigue and unexpected weight change.  HENT: Negative for congestion, nosebleeds, sneezing, sore throat and trouble swallowing.   Eyes: Negative for itching and visual disturbance.  Respiratory: Negative for cough.   Cardiovascular: Negative for chest pain, palpitations and leg swelling.  Gastrointestinal: Negative for abdominal distention, blood in stool, diarrhea and nausea.  Genitourinary: Negative for frequency and hematuria.  Musculoskeletal: Negative for back pain, gait problem, joint swelling and neck pain.  Skin: Negative for rash.  Neurological: Negative for dizziness, tremors, speech difficulty and weakness.  Psychiatric/Behavioral: Negative for agitation, dysphoric mood and sleep disturbance. The patient is not nervous/anxious.     Objective:  BP 126/76   Pulse 71   Temp 98.5 F (36.9 C) (Oral)   Ht 5\' 6"  (1.676  m)   Wt 167 lb (75.8 kg)   SpO2 97%   BMI 26.95 kg/m   BP Readings from Last 3 Encounters:  02/06/20 126/76  02/05/19 (!) 160/90  01/25/18 132/84    Wt Readings from Last 3 Encounters:  02/06/20 167 lb (75.8 kg)  02/05/19 172 lb (78 kg)  01/25/18 171 lb (77.6 kg)    Physical Exam Constitutional:      General: He is not in acute distress.    Appearance: He is well-developed. He is obese.     Comments: NAD  Eyes:     Conjunctiva/sclera: Conjunctivae normal.     Pupils: Pupils are equal, round, and reactive to light.  Neck:     Thyroid: No thyromegaly.     Vascular: No JVD.  Cardiovascular:     Rate and Rhythm: Normal rate and regular rhythm.     Heart sounds: Normal heart sounds. No murmur heard.  No friction rub. No gallop.   Pulmonary:     Effort: Pulmonary effort is normal. No respiratory distress.     Breath sounds: Normal breath sounds. No wheezing or rales.  Chest:     Chest wall: No tenderness.  Abdominal:     General: Bowel sounds are normal. There is no distension.     Palpations: Abdomen is soft. There is no mass.     Tenderness: There is no abdominal tenderness. There is no guarding or rebound.  Musculoskeletal:        General: No tenderness. Normal range of motion.     Cervical back: Normal range of  motion.  Lymphadenopathy:     Cervical: No cervical adenopathy.  Skin:    General: Skin is warm and dry.     Findings: No rash.  Neurological:     Mental Status: He is alert and oriented to person, place, and time.     Cranial Nerves: No cranial nerve deficit.     Motor: No abnormal muscle tone.     Coordination: Coordination normal.     Gait: Gait normal.     Deep Tendon Reflexes: Reflexes are normal and symmetric.  Psychiatric:        Behavior: Behavior normal.        Thought Content: Thought content normal.        Judgment: Judgment normal.   Pt declined rectal exam B gynecomastia  Procedure: EKG Indication: palpitations Impression: NSR. No  acute changes.   Lab Results  Component Value Date   WBC 9.0 02/05/2019   HGB 15.4 02/05/2019   HCT 45.5 02/05/2019   PLT 221.0 02/05/2019   GLUCOSE 110 (H) 02/05/2019   CHOL 237 (H) 02/05/2019   TRIG 218.0 (H) 02/05/2019   HDL 38.10 (L) 02/05/2019   LDLDIRECT 165.0 02/05/2019   LDLCALC 151 (H) 01/25/2018   ALT 18 02/05/2019   AST 15 02/05/2019   NA 139 02/05/2019   K 4.1 02/05/2019   CL 105 02/05/2019   CREATININE 0.72 02/05/2019   BUN 14 02/05/2019   CO2 26 02/05/2019   TSH 3.09 02/05/2019   PSA 2.19 02/05/2019    DG Chest 2 View  Result Date: 01/13/2016 CLINICAL DATA:  Routine examination, no current symptoms EXAM: CHEST  2 VIEW COMPARISON:  None in PACs FINDINGS: The lungs are adequately inflated and clear. The heart and pulmonary vascularity are normal. The mediastinum is normal in width. There is no pleural effusion. There is mild multilevel degenerative disc disease of the thoracic spine. IMPRESSION: There is no active cardiopulmonary disease. Electronically Signed   By: David  Martinique M.D.   On: 01/13/2016 16:26   Assessment & Plan:   There are no diagnoses linked to this encounter.   No orders of the defined types were placed in this encounter.    Follow-up: No follow-ups on file.  Walker Kehr, MD

## 2020-02-06 NOTE — Assessment & Plan Note (Signed)
EKG

## 2020-02-06 NOTE — Assessment & Plan Note (Addendum)
Pt declined rectal exam PSA

## 2020-02-06 NOTE — Assessment & Plan Note (Addendum)

## 2020-02-06 NOTE — Assessment & Plan Note (Signed)
B - new Mammo

## 2020-02-07 LAB — URINALYSIS
Bilirubin Urine: NEGATIVE
Glucose, UA: NEGATIVE
Hgb urine dipstick: NEGATIVE
Nitrite: NEGATIVE
Specific Gravity, Urine: 1.027 (ref 1.001–1.03)
pH: <=5 (ref 5.0–8.0)

## 2020-02-07 LAB — LIPID PANEL
Cholesterol: 223 mg/dL — ABNORMAL HIGH (ref <200)
HDL: 43 mg/dL (ref >=40)
LDL Cholesterol (Calc): 154 mg/dL (calc) — ABNORMAL HIGH
Non-HDL Cholesterol (Calc): 180 mg/dL (calc) — ABNORMAL HIGH (ref <130)
Total CHOL/HDL Ratio: 5.2 (calc) — ABNORMAL HIGH (ref <5.0)
Triglycerides: 140 mg/dL (ref <150)

## 2020-02-07 LAB — CBC WITH DIFFERENTIAL/PLATELET
Absolute Monocytes: 584 cells/uL (ref 200–950)
Basophils Absolute: 51 cells/uL (ref 0–200)
Basophils Relative: 0.7 %
Eosinophils Absolute: 161 cells/uL (ref 15–500)
Eosinophils Relative: 2.2 %
HCT: 47.1 % (ref 38.5–50.0)
Hemoglobin: 15.5 g/dL (ref 13.2–17.1)
Lymphs Abs: 1832 cells/uL (ref 850–3900)
MCH: 28.2 pg (ref 27.0–33.0)
MCHC: 32.9 g/dL (ref 32.0–36.0)
MCV: 85.8 fL (ref 80.0–100.0)
MPV: 10.7 fL (ref 7.5–12.5)
Monocytes Relative: 8 %
Neutro Abs: 4672 cells/uL (ref 1500–7800)
Neutrophils Relative %: 64 %
Platelets: 197 10*3/uL (ref 140–400)
RBC: 5.49 10*6/uL (ref 4.20–5.80)
RDW: 15.3 % — ABNORMAL HIGH (ref 11.0–15.0)
Total Lymphocyte: 25.1 %
WBC: 7.3 10*3/uL (ref 3.8–10.8)

## 2020-02-07 LAB — TSH: TSH: 2.86 mIU/L (ref 0.40–4.50)

## 2020-02-07 LAB — SEDIMENTATION RATE: Sed Rate: 6 mm/h (ref 0–20)

## 2020-02-07 LAB — PSA: PSA: 2.2 ng/mL (ref <=4.0)

## 2020-02-10 ENCOUNTER — Other Ambulatory Visit: Payer: Self-pay | Admitting: Internal Medicine

## 2020-02-10 DIAGNOSIS — N62 Hypertrophy of breast: Secondary | ICD-10-CM

## 2020-02-18 ENCOUNTER — Telehealth (INDEPENDENT_AMBULATORY_CARE_PROVIDER_SITE_OTHER): Payer: PPO

## 2020-02-18 DIAGNOSIS — R002 Palpitations: Secondary | ICD-10-CM | POA: Diagnosis not present

## 2020-02-18 NOTE — Telephone Encounter (Signed)
Added EKG from 02/06/20

## 2020-03-04 ENCOUNTER — Ambulatory Visit
Admission: RE | Admit: 2020-03-04 | Discharge: 2020-03-04 | Disposition: A | Discharge status: Home / Self Care | Payer: PPO | Source: Ambulatory Visit | Attending: Internal Medicine | Admitting: Internal Medicine

## 2020-03-04 ENCOUNTER — Other Ambulatory Visit: Payer: Self-pay

## 2020-03-04 ENCOUNTER — Other Ambulatory Visit: Payer: Self-pay | Admitting: Internal Medicine

## 2020-03-04 ENCOUNTER — Ambulatory Visit: Payer: PPO

## 2020-03-04 DIAGNOSIS — N6489 Other specified disorders of breast: Secondary | ICD-10-CM | POA: Diagnosis not present

## 2020-03-04 DIAGNOSIS — N62 Hypertrophy of breast: Secondary | ICD-10-CM

## 2020-03-04 DIAGNOSIS — R928 Other abnormal and inconclusive findings on diagnostic imaging of breast: Secondary | ICD-10-CM | POA: Diagnosis not present

## 2020-03-04 DIAGNOSIS — N6320 Unspecified lump in the left breast, unspecified quadrant: Secondary | ICD-10-CM

## 2020-03-11 ENCOUNTER — Ambulatory Visit
Admission: RE | Admit: 2020-03-11 | Discharge: 2020-03-11 | Disposition: A | Discharge status: Home / Self Care | Payer: PPO | Source: Ambulatory Visit | Attending: Internal Medicine | Admitting: Internal Medicine

## 2020-03-11 ENCOUNTER — Other Ambulatory Visit: Payer: Self-pay

## 2020-03-11 DIAGNOSIS — N6320 Unspecified lump in the left breast, unspecified quadrant: Secondary | ICD-10-CM

## 2020-03-11 DIAGNOSIS — R590 Localized enlarged lymph nodes: Secondary | ICD-10-CM | POA: Diagnosis not present

## 2020-03-11 DIAGNOSIS — N6322 Unspecified lump in the left breast, upper inner quadrant: Secondary | ICD-10-CM | POA: Diagnosis not present

## 2020-03-15 ENCOUNTER — Encounter: Payer: Self-pay | Admitting: Radiation Oncology

## 2020-03-16 ENCOUNTER — Encounter: Payer: Self-pay | Admitting: *Deleted

## 2020-03-16 DIAGNOSIS — C50222 Malignant neoplasm of upper-inner quadrant of left male breast: Secondary | ICD-10-CM | POA: Insufficient documentation

## 2020-03-16 DIAGNOSIS — Z170 Estrogen receptor positive status [ER+]: Secondary | ICD-10-CM | POA: Insufficient documentation

## 2020-03-16 DIAGNOSIS — C50212 Malignant neoplasm of upper-inner quadrant of left female breast: Secondary | ICD-10-CM

## 2020-03-17 ENCOUNTER — Telehealth: Payer: Self-pay | Admitting: Hematology and Oncology

## 2020-03-17 NOTE — Telephone Encounter (Signed)
Spoke to patient to confirm after [facility] appointment for 9/29, no packet sent, will get info when patient arrives to clinic

## 2020-03-17 NOTE — Progress Notes (Signed)
[name] CONSULT NOTE  Patient Care Team: Plotnikov, Evie Lacks, MD as PCP - General (Internal Medicine) Rockwell Germany, RN as Oncology Nurse Navigator Mauro Kaufmann, RN as Oncology Nurse Navigator Erroll Luna, MD as Consulting Physician (General Surgery) Nicholas Lose, MD as Consulting Physician (Hematology and Oncology) Kyung Rudd, MD as Consulting Physician (Radiation Oncology)  CHIEF COMPLAINTS/PURPOSE OF CONSULTATION:  Newly diagnosed breast cancer  HISTORY OF PRESENTING ILLNESS:  Danny Gutierrez 74 y.o. male is here because of recent diagnosis of invasive mammary carcinoma of the left breast. Patient palpated a left breast mass. Diagnostic mammogram and [place] on 03/04/20 showed no right breast malignancy and a 3.0cm mass in the upper inner quadrant of the left breast with a promnent left axillary lymph node. Biopsy on 03/11/20 showed invasive mammary carcinoma in the breast and axilla, grade 2, HER-2 equivocal by IHC (2+), ER/PR+ >95%, Ki67 25%. He presents to the clinic today for initial evaluation and disucssion of treatment options.   I reviewed his records extensively and collaborated the history with the patient.  SUMMARY OF ONCOLOGIC HISTORY: Oncology History  Malignant neoplasm of upper-inner quadrant of left breast in male, estrogen receptor positive ([place])  03/12/2019 Initial Diagnosis   Patient palpated a left breast mass. Diagnostic mammogram and US showed no right breast malignancy and a 3.0cm mass in the upper inner quadrant of the left breast with a promnent left axillary lymph node. Biopsy showed invasive mammary carcinoma in the breast and axilla, grade 2, HER-2 equivocal by IHC (2+), ER/PR+ >95%, Ki67 25%.     MEDICAL HISTORY:  Past Medical History:  Diagnosis Date  . Breast cancer [facility])     SURGICAL HISTORY: Past Surgical History:  Procedure Laterality Date  . ADENOIDECTOMY  1957  . ADENOIDECTOMY     childhood    SOCIAL  HISTORY: Social History   Socioeconomic History  . Marital status: Legally Separated    Spouse name: Not on file  . Number of children: Not on file  . Years of education: Not on file  . Highest education level: Not on file  Occupational History  . Not on file  Tobacco Use  . Smoking status: Never Smoker  . Smokeless tobacco: Never Used  Substance and Sexual Activity  . Alcohol use: No  . Drug use: No  . Sexual activity: Not Currently  Other Topics Concern  . Not on file  Social History Narrative  . Not on file   Social Determinants of Health   Financial Resource Strain:   . Difficulty of Paying Living Expenses: Not on file  Food Insecurity:   . Worried About Charity fundraiser in the Last Year: Not on file  . Ran Out of Food in the Last Year: Not on file  Transportation Needs:   . Lack of Transportation (Medical): Not on file  . Lack of Transportation (Non-Medical): Not on file  Physical Activity:   . Days of Exercise per Week: Not on file  . Minutes of Exercise per Session: Not on file  Stress:   . Feeling of Stress : Not on file  Social Connections:   . Frequency of Communication with Friends and Family: Not on file  . Frequency of Social Gatherings with Friends and Family: Not on file  . Attends Religious Services: Not on file  . Active Member of Clubs or Organizations: Not on file  . Attends Archivist Meetings: Not on file  . Marital Status: Not  on file  Intimate Partner Violence:   . Fear of Current or Ex-Partner: Not on file  . Emotionally Abused: Not on file  . Physically Abused: Not on file  . Sexually Abused: Not on file    FAMILY HISTORY: Family History  Problem Relation Age of Onset  . Cancer Mother 76       breast  . Breast cancer Mother 55  . Heart disease Father 61       ?CAD    ALLERGIES:  has No Known Allergies.  MEDICATIONS:  Current Outpatient Medications  Medication Sig Dispense Refill  . Ascorbic Acid (SM VITAMIN C)  500 MG CHEW Chew by mouth.    . Cholecalciferol 1000 UNITS tablet Take 1,000 Units by mouth daily.    . dorzolamide-timolol (COSOPT) 22.3-6.8 MG/ML ophthalmic solution INT 1 GTT IN OU BID  6  . Multiple Minerals-Vitamins (CALCIUM-MAGNESIUM-ZINC-D3 PO) Take by mouth.    . Multiple Vitamins-Minerals (CENTRUM SILVER ADULT 50+ PO) Take by mouth.    . Multiple Vitamins-Minerals (OCUVITE EYE HEALTH FORMULA PO) Take by mouth daily.    . Multiple Vitamins-Minerals (ONE-A-DAY PROACTIVE 65+ PO) Take by mouth.    Marland Kitchen OVER THE COUNTER MEDICATION Focus Factor    . Pollen Extracts (PROSTAT PO) Take by mouth. prostagenix    . SUPER B COMPLEX/C PO Take by mouth.     No current facility-administered medications for this visit.    REVIEW OF SYSTEMS:   Constitutional: Denies fevers, chills or abnormal night sweats Eyes: Denies blurriness of vision, double vision or watery eyes Ears, nose, mouth, throat, and face: Denies mucositis or sore throat Respiratory: Denies cough, dyspnea or wheezes Cardiovascular: Denies palpitation, chest discomfort or lower extremity swelling Gastrointestinal:  Denies nausea, heartburn or change in bowel habits Skin: Denies abnormal skin rashes Lymphatics: Denies new lymphadenopathy or easy bruising Neurological:Denies numbness, tingling or new weaknesses Behavioral/Psych: Mood is stable, no new changes  Breast: Palpable left breast mass All other systems were reviewed with the patient and are negative.  PHYSICAL EXAMINATION: ECOG PERFORMANCE STATUS: 1 - Symptomatic but completely ambulatory  Vitals:   03/18/20 1246  BP: (!) 162/99  Pulse: 76  Resp: 18  Temp: 98.3 F (36.8 C)  SpO2: 96%   Filed Weights   03/18/20 1246  Weight: 159 lb 14.4 oz (72.5 kg)      BREAST: Palpable mass in the left breast (exam performed in the presence of a chaperone)   LABORATORY DATA:  I have reviewed the data as listed Lab Results  Component Value Date   WBC 9.0 03/18/2020   HGB  15.1 03/18/2020   HCT 45.0 03/18/2020   MCV 83.8 03/18/2020   PLT 199 03/18/2020   Lab Results  Component Value Date   NA 139 03/18/2020   K 4.0 03/18/2020   CL 105 03/18/2020   CO2 29 03/18/2020    RADIOGRAPHIC STUDIES: I have personally reviewed the radiological reports and agreed with the findings in the report.  ASSESSMENT AND PLAN:  Malignant neoplasm of upper-inner quadrant of left breast in male, estrogen receptor positive ([place]) 03/11/2020: Patient palpated a left breast mass. Diagnostic mammogram and US showed no right breast malignancy and a 3.0cm mass in the upper inner quadrant of the left breast with a promnent left axillary lymph node. Biopsy showed invasive mammary carcinoma in the breast and axilla, grade 2, HER-2 equivocal by IHC (2+), ER/PR+ >95%, Ki67 25%.  Pathology and radiology counseling:Discussed with the patient, the details of pathology  including the type of breast cancer,the clinical staging, the significance of ER, PR and HER-2/neu receptors and the implications for treatment. After reviewing the pathology in detail, we proceeded to discuss the different treatment options between surgery, radiation, chemotherapy, antiestrogen therapies.  Recommendations: 1. Breast conserving surgery followed by 2. MammaPrint testing to determine if chemotherapy would be of any benefit followed by 3. Adjuvant antiestrogen therapy with tamoxifen x10 years Genetic counseling and testing  Mammaprint counseling: [employer] is a prospective, randomized phase III controlled trial that investigates the clinical utility of MammaPrint, when compared to standard clinical pathological criteria, with 6,693 patients enrolled from over 111 institutions. Clinical high-risk patients with a Low Risk MammaPrint result, including 48% node-positive, had 5-year distant metastasis-free survival rate in excess of 94 percent, whether randomized to receive adjuvant chemotherapy or not proving MammaPrint's  ability to safely identify Low Risk patients.  Return to clinic after surgery to discuss final pathology report and then determine if MammaPrint testing will need to be sent.  All questions were answered. The patient knows to call the clinic with any problems, questions or concerns.    Rulon Eisenmenger, MD, MPH 03/18/2020   I, Molly Dorshimer, am acting as scribe for Nicholas Lose, MD.  I have reviewed the above documentation for accuracy and completeness, and I agree with the above.

## 2020-03-18 ENCOUNTER — Ambulatory Visit: Payer: PPO | Attending: Surgery | Admitting: Physical Therapy

## 2020-03-18 ENCOUNTER — Inpatient Hospital Stay: Payer: PPO

## 2020-03-18 ENCOUNTER — Other Ambulatory Visit: Payer: Self-pay

## 2020-03-18 ENCOUNTER — Inpatient Hospital Stay (HOSPITAL_BASED_OUTPATIENT_CLINIC_OR_DEPARTMENT_OTHER): Payer: PPO | Admitting: Genetic Counselor

## 2020-03-18 ENCOUNTER — Inpatient Hospital Stay: Payer: PPO | Attending: Hematology and Oncology | Admitting: Hematology and Oncology

## 2020-03-18 ENCOUNTER — Ambulatory Visit
Admission: RE | Admit: 2020-03-18 | Discharge: 2020-03-18 | Disposition: A | Discharge status: Home / Self Care | Payer: PPO | Source: Ambulatory Visit | Attending: Radiation Oncology | Admitting: Radiation Oncology

## 2020-03-18 ENCOUNTER — Encounter: Payer: Self-pay | Admitting: Physical Therapy

## 2020-03-18 ENCOUNTER — Other Ambulatory Visit: Payer: Self-pay | Admitting: *Deleted

## 2020-03-18 ENCOUNTER — Encounter: Payer: Self-pay | Admitting: Hematology and Oncology

## 2020-03-18 ENCOUNTER — Ambulatory Visit: Payer: Self-pay | Admitting: Surgery

## 2020-03-18 DIAGNOSIS — Z170 Estrogen receptor positive status [ER+]: Secondary | ICD-10-CM

## 2020-03-18 DIAGNOSIS — R293 Abnormal posture: Secondary | ICD-10-CM | POA: Diagnosis not present

## 2020-03-18 DIAGNOSIS — C50222 Malignant neoplasm of upper-inner quadrant of left male breast: Secondary | ICD-10-CM

## 2020-03-18 DIAGNOSIS — C50212 Malignant neoplasm of upper-inner quadrant of left female breast: Secondary | ICD-10-CM

## 2020-03-18 DIAGNOSIS — C50912 Malignant neoplasm of unspecified site of left female breast: Secondary | ICD-10-CM | POA: Diagnosis not present

## 2020-03-18 DIAGNOSIS — C773 Secondary and unspecified malignant neoplasm of axilla and upper limb lymph nodes: Secondary | ICD-10-CM | POA: Diagnosis not present

## 2020-03-18 DIAGNOSIS — Z803 Family history of malignant neoplasm of breast: Secondary | ICD-10-CM

## 2020-03-18 DIAGNOSIS — Z1501 Genetic susceptibility to malignant neoplasm of breast: Secondary | ICD-10-CM | POA: Diagnosis not present

## 2020-03-18 LAB — CBC WITH DIFFERENTIAL (CANCER CENTER ONLY)
Abs Immature Granulocytes: 0.02 10*3/uL (ref 0.00–0.07)
Basophils Absolute: 0 10*3/uL (ref 0.0–0.1)
Basophils Relative: 0 %
Eosinophils Absolute: 0.2 10*3/uL (ref 0.0–0.5)
Eosinophils Relative: 3 %
HCT: 45 % (ref 39.0–52.0)
Hemoglobin: 15.1 g/dL (ref 13.0–17.0)
Immature Granulocytes: 0 %
Lymphocytes Relative: 22 %
Lymphs Abs: 2 10*3/uL (ref 0.7–4.0)
MCH: 28.1 pg (ref 26.0–34.0)
MCHC: 33.6 g/dL (ref 30.0–36.0)
MCV: 83.8 fL (ref 80.0–100.0)
Monocytes Absolute: 0.7 10*3/uL (ref 0.1–1.0)
Monocytes Relative: 8 %
Neutro Abs: 6.1 10*3/uL (ref 1.7–7.7)
Neutrophils Relative %: 67 %
Platelet Count: 199 10*3/uL (ref 150–400)
RBC: 5.37 MIL/uL (ref 4.22–5.81)
RDW: 14.8 % (ref 11.5–15.5)
WBC Count: 9 10*3/uL (ref 4.0–10.5)
nRBC: 0 % (ref 0.0–0.2)

## 2020-03-18 LAB — CMP (CANCER CENTER ONLY)
ALT: 21 U/L (ref 0–44)
AST: 16 U/L (ref 15–41)
Albumin: 3.6 g/dL (ref 3.5–5.0)
Alkaline Phosphatase: 69 U/L (ref 38–126)
Anion gap: 5 (ref 5–15)
BUN: 12 mg/dL (ref 8–23)
CO2: 29 mmol/L (ref 22–32)
Calcium: 9.6 mg/dL (ref 8.9–10.3)
Chloride: 105 mmol/L (ref 98–111)
Creatinine: 0.78 mg/dL (ref 0.61–1.24)
GFR, Est AFR Am: >60 mL/min (ref >60)
GFR, Estimated: >60 mL/min (ref >60)
Glucose, Bld: 122 mg/dL — ABNORMAL HIGH (ref 70–99)
Potassium: 4 mmol/L (ref 3.5–5.1)
Sodium: 139 mmol/L (ref 135–145)
Total Bilirubin: 0.6 mg/dL (ref 0.3–1.2)
Total Protein: 7.4 g/dL (ref 6.5–8.1)

## 2020-03-18 LAB — GENETIC SCREENING ORDER

## 2020-03-18 NOTE — Progress Notes (Signed)
Radiation Oncology         [phone]) [phone] ________________________________  Name: Danny Gutierrez        MRN: [id number]  Date of Service: 03/18/2020 DOB: [birth year]  TM:[email or web address], Evie Lacks, MD  Erroll Luna, MD     REFERRING PHYSICIAN: Erroll Luna, MD   DIAGNOSIS: The encounter diagnosis was Malignant neoplasm of upper-inner quadrant of left breast in male, estrogen receptor positive ([place]).   HISTORY OF PRESENT ILLNESS: Danny Gutierrez is a 74 y.o. male seen in the multidisciplinary breast clinic for a new diagnosis of left male breast cancer. The patient was noted to have palpable mass in the left breast. He was found to have a mass at 10:00 measuring 2.3 x 2.3 x 1.6 cm and at least one abnormal node was seen. He underwent a biopsy on 03/04/20 that revealed a grade 2 invasive ductal carcinoma with associated DCIS and LVSI. The tumor was ER/PR positive, HER2 was negative and his Ki 67 was 25%. His lymph node was also sampled and was positive for metastatic disease. He's seen today to discuss options of treatment of his cancer.    PREVIOUS RADIATION THERAPY: No   PAST MEDICAL HISTORY:  Past Medical History:  Diagnosis Date  . Breast cancer ([place])   . Family history of breast cancer        PAST SURGICAL HISTORY: Past Surgical History:  Procedure Laterality Date  . ADENOIDECTOMY  1957  . ADENOIDECTOMY     childhood     FAMILY HISTORY:  Family History  Problem Relation Age of Onset  . Breast cancer Mother 36  . Heart disease Father 67       ?CAD     SOCIAL HISTORY:  reports that he has never smoked. He has never used smokeless tobacco. He reports that he does not drink alcohol and does not use drugs. The patient is from [place], he has been in the [place] for about a year. His son lives in [place].    ALLERGIES: Patient has no known allergies.   MEDICATIONS:  Current Outpatient Medications  Medication Sig Dispense Refill  . Ascorbic Acid (SM VITAMIN C)  500 MG CHEW Chew by mouth.    . Cholecalciferol 1000 UNITS tablet Take 1,000 Units by mouth daily.    . dorzolamide-timolol (COSOPT) 22.3-6.8 MG/ML ophthalmic solution INT 1 GTT IN OU BID  6  . Multiple Minerals-Vitamins (CALCIUM-MAGNESIUM-ZINC-D3 PO) Take by mouth.    . Multiple Vitamins-Minerals (CENTRUM SILVER ADULT 50+ PO) Take by mouth.    . Multiple Vitamins-Minerals (OCUVITE EYE HEALTH FORMULA PO) Take by mouth daily.    . Multiple Vitamins-Minerals (ONE-A-DAY PROACTIVE 65+ PO) Take by mouth.    Marland Kitchen OVER THE COUNTER MEDICATION Focus Factor    . Pollen Extracts (PROSTAT PO) Take by mouth. prostagenix    . SUPER B COMPLEX/C PO Take by mouth.     No current facility-administered medications for this encounter.     REVIEW OF SYSTEMS: On review of systems, the patient reports that he is not having any symptoms of chest pain or drainage from the nipple.       PHYSICAL EXAM:  Wt Readings from Last 3 Encounters:  03/18/20 159 lb 14.4 oz (72.5 kg)  02/06/20 167 lb (75.8 kg)  02/05/19 172 lb (78 kg)   Temp Readings from Last 3 Encounters:  03/18/20 98.3 F (36.8 C) (Tympanic)  02/06/20 98.5 F (36.9 C) (Oral)  02/05/19 98.8 F (37.1 C) (Oral)   BP Readings from  Last 3 Encounters:  03/18/20 (!) 162/99  02/06/20 126/76  02/05/19 (!) 160/90   Pulse Readings from Last 3 Encounters:  03/18/20 76  02/06/20 71  02/05/19 80   In general this is a well appearing [place] european male in no acute distress. He's alert and oriented x4 and appropriate throughout the examination. Cardiopulmonary assessment is negative for acute distress and he exhibits normal effort. Breast exam is deferred.    ECOG = 1  0 - Asymptomatic (Fully active, able to carry on all predisease activities without restriction)  1 - Symptomatic but completely ambulatory (Restricted in physically strenuous activity but ambulatory and able to carry out work of a light or sedentary nature. For example, light  housework, office work)  2 - Symptomatic, <50% in bed during the day (Ambulatory and capable of all self care but unable to carry out any work activities. Up and about more than 50% of waking hours)  3 - Symptomatic, >50% in bed, but not bedbound (Capable of only limited self-care, confined to bed or chair 50% or more of waking hours)  4 - Bedbound (Completely disabled. Cannot carry on any self-care. Totally confined to bed or chair)  5 - Death   Eustace Pen MM, Creech RH, Tormey DC, et al. [phone]). "Toxicity and response criteria of the [facility]". [place] Oncol. 5 (6): 649-55    LABORATORY DATA:  Lab Results  Component Value Date   WBC 9.0 03/18/2020   HGB 15.1 03/18/2020   HCT 45.0 03/18/2020   MCV 83.8 03/18/2020   PLT 199 03/18/2020   Lab Results  Component Value Date   NA 139 03/18/2020   K 4.0 03/18/2020   CL 105 03/18/2020   CO2 29 03/18/2020   Lab Results  Component Value Date   ALT 21 03/18/2020   AST 16 03/18/2020   ALKPHOS 69 03/18/2020   BILITOT 0.6 03/18/2020      RADIOGRAPHY: US BREAST LTD UNI LEFT INC AXILLA  Result Date: 03/04/2020 CLINICAL DATA:  74 year old male complaining of a left breast mass. EXAM: DIGITAL DIAGNOSTIC BILATERAL MAMMOGRAM WITH CAD AND TOMO ULTRASOUND LEFT BREAST COMPARISON:  None. ACR Breast Density Category b: There are scattered areas of fibroglandular density. FINDINGS: No suspicious mass or malignant type microcalcifications identified in the right breast. In the upper-inner quadrant of the left breast is an irregular mass measuring 3 cm. There is a prominent lymph node seen in the left axilla on the MLO view. Mammographic images were processed with CAD. On physical exam, I palpate a discrete mass in the left breast at 10 o'clock 3 cm from the nipple. Targeted ultrasound is performed, showing an irregular hypoechoic mass in the left breast at 10 o'clock 3 cm from the nipple measuring 2.3 x 1.6 x 2.3 cm.  Sonographic evaluation of the left axilla shows a single abnormal lymph node with cortical thickening measuring 6 mm. IMPRESSION: Suspicious mass in the 10 o'clock region of the left breast and an abnormal left axillary lymph node. RECOMMENDATION: Ultrasound-guided core biopsies of the mass in the 10 o'clock region of the left breast and left axillary lymph node is recommended. I have discussed the findings and recommendations with the patient. If applicable, a reminder letter will be sent to the patient regarding the next appointment. BI-RADS CATEGORY  5: Highly suggestive of malignancy. Electronically Signed   By: Lillia Mountain M.D.   On: 03/04/2020 13:00   MM DIAG BREAST TOMO BILATERAL  Result Date: 03/04/2020  CLINICAL DATA:  74 year old male complaining of a left breast mass. EXAM: DIGITAL DIAGNOSTIC BILATERAL MAMMOGRAM WITH CAD AND TOMO ULTRASOUND LEFT BREAST COMPARISON:  None. ACR Breast Density Category b: There are scattered areas of fibroglandular density. FINDINGS: No suspicious mass or malignant type microcalcifications identified in the right breast. In the upper-inner quadrant of the left breast is an irregular mass measuring 3 cm. There is a prominent lymph node seen in the left axilla on the MLO view. Mammographic images were processed with CAD. On physical exam, I palpate a discrete mass in the left breast at 10 o'clock 3 cm from the nipple. Targeted ultrasound is performed, showing an irregular hypoechoic mass in the left breast at 10 o'clock 3 cm from the nipple measuring 2.3 x 1.6 x 2.3 cm. Sonographic evaluation of the left axilla shows a single abnormal lymph node with cortical thickening measuring 6 mm. IMPRESSION: Suspicious mass in the 10 o'clock region of the left breast and an abnormal left axillary lymph node. RECOMMENDATION: Ultrasound-guided core biopsies of the mass in the 10 o'clock region of the left breast and left axillary lymph node is recommended. I have discussed the findings  and recommendations with the patient. If applicable, a reminder letter will be sent to the patient regarding the next appointment. BI-RADS CATEGORY  5: Highly suggestive of malignancy. Electronically Signed   By: Lillia Mountain M.D.   On: 03/04/2020 13:00   [place] AXILLARY NODE CORE BIOPSY LEFT  Addendum Date: 03/13/2020   ADDENDUM REPORT: 03/13/2020 08:12 ADDENDUM: Pathology revealed #1 GRADE II INVASIVE MAMMARY CARCINOMA, MAMMARY CARCINOMA IN SITU, LYMPHOVASCULAR INVASION IS PRESENT of the LEFT breast, 10 o'clock mass (ribbon clip). This was found to be concordant by Dr. Nolon Nations. Pathology revealed #2 MAMMARY CARCINOMA of the LEFT axillary lymph node (Q clip). There is no residual lymph node tissue. The carcinoma appears similar to part #1. This was found to be concordant by Dr. Nolon Nations. Pathology results were discussed with the patient by telephone with Terie Purser RN. The patient reported doing well after the biopsies with tenderness at the sites. Post biopsy instructions and care were reviewed and questions were answered. The patient was encouraged to call [facility] for any additional concerns. The patient was referred to [facility] at [facility] on March 18, 2020. Per patient request, a Language Interpreter has been requested for this appointment. Pathology results reported by Stacie Acres RN on 03/13/2020. Electronically Signed   By: Nolon Nations M.D.   On: 03/13/2020 08:12   Result Date: 03/13/2020 CLINICAL DATA:  Patient presents for ultrasound-guided core biopsy of mass in the 10 o'clock location of the LEFT breast and biopsy of LEFT axillary lymph node. EXAM: ULTRASOUND GUIDED LEFT BREAST CORE NEEDLE BIOPSY ULTRASOUND-GUIDED LEFT AXILLARY CORE NEEDLE BIOPSY COMPARISON:  Previous exam(s). PROCEDURE: I met with the patient and we discussed the procedure of ultrasound-guided biopsy, including  benefits and alternatives. We discussed the high likelihood of a successful procedure. We discussed the risks of the procedure, including infection, bleeding, tissue injury, clip migration, and inadequate sampling. Informed written consent was given. The usual time-out protocol was performed immediately prior to the procedure. Site 1:10 o'clock location LEFT breast. Lesion quadrant: UPPER INNER QUADRANT LEFT breast. Ribbon clip Using sterile technique and 1% Lidocaine as local anesthetic, under direct ultrasound visualization, a 12 gauge spring-loaded device was used to perform biopsy of mass in the 10 o'clock location the  LEFT breast using a LATERAL to the approach. At the conclusion of the procedure ribbon shaped tissue marker clip was deployed into the biopsy cavity. Site 2: LEFT axilla.  Q shaped clip Using sterile technique and 1% Lidocaine as local anesthetic, under direct ultrasound visualization, a 14 gauge spring-loaded device was used to perform biopsy of enlarged LEFT axillary lymph node using a LATERAL to MEDIAL approach. At the conclusion of the procedure Q shaped tissue marker clip was deployed into the biopsy cavity. Follow up 2 view mammogram was performed and dictated separately. IMPRESSION: Ultrasound guided biopsy of mass in the 10 o'clock location of the LEFT breast and enlarged LEFT axillary lymph node. No apparent complications. Electronically Signed: By: Nolon Nations M.D. On: 03/11/2020 10:52   MM CLIP PLACEMENT LEFT  Result Date: 03/11/2020 CLINICAL DATA:  Status post ultrasound-guided core biopsy of mass in the 10 o'clock location of the LEFT breast. Status post ultrasound-guided core biopsy of LEFT axillary lymph node. EXAM: DIAGNOSTIC LEFT MAMMOGRAM POST ULTRASOUND BIOPSY x2 COMPARISON:  Previous exam(s). FINDINGS: Mammographic images were obtained following ultrasound guided biopsy of mass in the 10 o'clock location of the LEFT breast and placement of a ribbon shaped clip. The  biopsy marking clip is in expected position at the site of biopsy. Following biopsy of LEFT axillary lymph node, a Q shaped clip is placed and is identified in the expected location. IMPRESSION: Tissue marker clips are in the expected locations after biopsy. Final Assessment: Post Procedure Mammograms for Marker Placement Electronically Signed   By: Nolon Nations M.D.   On: 03/11/2020 10:55   [place] LT BREAST BX W LOC DEV 1ST LESION IMG BX SPEC US GUIDE  Addendum Date: 03/13/2020   ADDENDUM REPORT: 03/13/2020 08:12 ADDENDUM: Pathology revealed #1 GRADE II INVASIVE MAMMARY CARCINOMA, MAMMARY CARCINOMA IN SITU, LYMPHOVASCULAR INVASION IS PRESENT of the LEFT breast, 10 o'clock mass (ribbon clip). This was found to be concordant by Dr. Nolon Nations. Pathology revealed #2 MAMMARY CARCINOMA of the LEFT axillary lymph node (Q clip). There is no residual lymph node tissue. The carcinoma appears similar to part #1. This was found to be concordant by Dr. Nolon Nations. Pathology results were discussed with the patient by telephone with Terie Purser RN. The patient reported doing well after the biopsies with tenderness at the sites. Post biopsy instructions and care were reviewed and questions were answered. The patient was encouraged to call [facility] for any additional concerns. The patient was referred to [facility] at [facility] on March 18, 2020. Per patient request, a Language Interpreter has been requested for this appointment. Pathology results reported by Stacie Acres RN on 03/13/2020. Electronically Signed   By: Nolon Nations M.D.   On: 03/13/2020 08:12   Result Date: 03/13/2020 CLINICAL DATA:  Patient presents for ultrasound-guided core biopsy of mass in the 10 o'clock location of the LEFT breast and biopsy of LEFT axillary lymph node. EXAM: ULTRASOUND GUIDED LEFT BREAST CORE NEEDLE BIOPSY ULTRASOUND-GUIDED  LEFT AXILLARY CORE NEEDLE BIOPSY COMPARISON:  Previous exam(s). PROCEDURE: I met with the patient and we discussed the procedure of ultrasound-guided biopsy, including benefits and alternatives. We discussed the high likelihood of a successful procedure. We discussed the risks of the procedure, including infection, bleeding, tissue injury, clip migration, and inadequate sampling. Informed written consent was given. The usual time-out protocol was performed immediately prior to the procedure. Site 1:10 o'clock location LEFT breast. Lesion  quadrant: UPPER INNER QUADRANT LEFT breast. Ribbon clip Using sterile technique and 1% Lidocaine as local anesthetic, under direct ultrasound visualization, a 12 gauge spring-loaded device was used to perform biopsy of mass in the 10 o'clock location the LEFT breast using a LATERAL to the approach. At the conclusion of the procedure ribbon shaped tissue marker clip was deployed into the biopsy cavity. Site 2: LEFT axilla.  Q shaped clip Using sterile technique and 1% Lidocaine as local anesthetic, under direct ultrasound visualization, a 14 gauge spring-loaded device was used to perform biopsy of enlarged LEFT axillary lymph node using a LATERAL to MEDIAL approach. At the conclusion of the procedure Q shaped tissue marker clip was deployed into the biopsy cavity. Follow up 2 view mammogram was performed and dictated separately. IMPRESSION: Ultrasound guided biopsy of mass in the 10 o'clock location of the LEFT breast and enlarged LEFT axillary lymph node. No apparent complications. Electronically Signed: By: Nolon Nations M.D. On: 03/11/2020 10:52       IMPRESSION/PLAN: 1. Stage IIA, cT2N1M0 grade 2, ER/PR positive invasive ductal arinoma of the left male breast.Dr. Lisbeth Renshaw discusses the pathology findings and reviews the nature of male breast disease. The consensus from the breast conference includes mastectomy with targeted node dissection. He will have Mammaprint testing to  determine a role for systemic chemotherapy. His case would be followed by postmastectomy radiotherapy as well as  antiestrogen therapy. We discussed the risks, benefits, short, and long term effects of radiotherapy, and the patient is interested in proceeding. Dr. Lisbeth Renshaw discusses the delivery and logistics of radiotherapy and anticipates a course of 6 1/2 weeks of radiotherapy to the chest wall and regional nodes with deep inspiration breath hold technique. We will see him back af ew weeks after surgery, or chemotherapy if needed to discuss proceeding with radiotherapy. 2 Possible genetic predisposition to malignancy. The patient is a candidate for genetic testing given his personal and family history. He was offered referral and he is interested in meeting today with genetics.   In a visit lasting 60 minutes, greater than 50% of the time was spent face to face reviewing her case, as well as in preparation of, discussing, and coordinating the patient's care.  The above documentation reflects my direct findings during this shared patient visit. Please see the separate note by Dr. Lisbeth Renshaw on this date for the remainder of the patient's plan of care.    Carola Rhine, PAC

## 2020-03-18 NOTE — Assessment & Plan Note (Signed)
03/11/2020: Patient palpated a left breast mass. Diagnostic mammogram and US showed no right breast malignancy and a 3.0cm mass in the upper inner quadrant of the left breast with a promnent left axillary lymph node. Biopsy showed invasive mammary carcinoma in the breast and axilla, grade 2, HER-2 equivocal by IHC (2+), ER/PR+ >95%, Ki67 25%.  Pathology and radiology counseling:Discussed with the patient, the details of pathology including the type of breast cancer,the clinical staging, the significance of ER, PR and HER-2/neu receptors and the implications for treatment. After reviewing the pathology in detail, we proceeded to discuss the different treatment options between surgery, radiation, chemotherapy, antiestrogen therapies.  Recommendations: 1. Breast conserving surgery followed by 2. MammaPrint testing to determine if chemotherapy would be of any benefit followed by 3. Adjuvant antiestrogen therapy with tamoxifen x10 years Genetic counseling and testing  Mammaprint counseling: MINDACT is a prospective, randomized phase III controlled trial that investigates the clinical utility of MammaPrint, when compared to standard clinical pathological criteria, with 6,693 patients enrolled from over 111 institutions. Clinical high-risk patients with a Low Risk MammaPrint result, including 48% node-positive, had 5-year distant metastasis-free survival rate in excess of 94 percent, whether randomized to receive adjuvant chemotherapy or not proving MammaPrint's ability to safely identify Low Risk patients.   Return to clinic after surgery to discuss final pathology report and then determine if MammaPrint testing will need to be sent.

## 2020-03-18 NOTE — H&P (Signed)
Danny Gutierrez Appointment: 03/18/2020 1:00 PM Location: [facility] Patient #: [id number] DOB: [birth year] Undefined / Language: Danny Gutierrez / Race: White Male  History of Present Illness Marcello Moores A. Merced Hanners MD; 03/18/2020 2:37 PM) Patient words: Pt seen in the [facility] for left breast mass. Mammogram showed a2.3 cm mass UOQ core bx IDC / DCIS grade 2 er pos pr pos her 2 neu neg ki 25%. He has no complaints except mass in the breast for 3 months.  The patient is a 74 year old male.   Medication History Conni Slipper, RN; 03/18/2020 8:18 AM) Medications Reconciled     Review of Systems Marcello Moores A. Navya Timmons MD; 03/18/2020 2:37 PM) All other systems negative   Physical Exam (Melea Prezioso A. Merie Wulf MD; 03/18/2020 2:44 PM)  General Mental Status-Alert. General Appearance-Consistent with stated age. Hydration-Well hydrated. Voice-Normal.  Head and Neck Head-normocephalic, atraumatic with no lesions or palpable masses.  Chest and Lung Exam Chest and lung exam reveals -quiet, even and easy respiratory effort with no use of accessory muscles and on auscultation, normal breath sounds, no adventitious sounds and normal vocal resonance. Inspection Chest Wall - Normal. Back - normal.  Breast Note: left breast UOQ 3 cm mobile mass  Cardiovascular Cardiovascular examination reveals -on palpation PMI is normal in location and amplitude, no palpable S3 or S4. Normal cardiac borders., normal heart sounds, regular rate and rhythm with no murmurs, carotid auscultation reveals no bruits and normal pedal pulses bilaterally.  Neurologic Neurologic evaluation reveals -alert and oriented x 3 with no impairment of recent or remote memory. Mental Status-Normal.  Lymphatic Axillary -Note:bruising left axilla no masses  right breast normal.     Assessment & Plan (Damier Disano A. Jiles Goya MD; 03/18/2020 2:40 PM)  BREAST CANCER, LEFT (C50.912) Impression: left mastectomy with  targeted LN bx and SLN mapping Risk of sentinel lymph node mapping include bleeding, infection, lymphedema, shoulder pain. stiffness, dye allergy. cosmetic deformity , blood clots, death, need for more surgery. Pt agrees to proceed. Discussed treatment options for breast cancer to include breast conservation vs mastectomy with reconstruction. Pt has decided on mastectomy. Risk include bleeding, infection, flap necrosis, pain, numbness, recurrence, hematoma, other surgery needs. Pt understands and agrees to proceed.  total time 45 min  Current Plans Pt Education - [facility] Mastectomy HCI Pt Education - [facility] Breast Cancer Information Given - [facility] "Breast Journey" Package You are being scheduled for surgery- Our schedulers will call you.  You should hear from our office's scheduling department within 5 working days about the location, date, and time of surgery. We try to make accommodations for patient's preferences in scheduling surgery, but sometimes the OR schedule or the surgeon's schedule prevents [place] from making those accommodations.  If you have not heard from our office [phone]) in 5 working days, call the office and ask for your surgeon's nurse.  If you have other questions about your diagnosis, plan, or surgery, call the office and ask for your surgeon's nurse.  Pt Education - [facility] breast cancer surgery: discussed with patient and provided information.

## 2020-03-18 NOTE — Therapy (Signed)
[place], [place], [address] Phone: [phone]   Fax:  [phone]  Physical Therapy Evaluation  Patient Details  Name: Danny Gutierrez MRN: [id number] Date of Birth: 1945/11/14 Referring Provider (PT): Dr. Erroll Luna   Encounter Date: 03/18/2020   PT End of Session - 03/18/20 2009    Visit Number 1    Number of Visits 2    Date for PT Re-Evaluation 05/13/20    PT Start Time 1500    PT Stop Time 1530    PT Time Calculation (min) 30 min    Activity Tolerance Patient tolerated treatment well    Behavior During Therapy [facility] for tasks assessed/performed           Past Medical History:  Diagnosis Date  . Breast cancer [facility])     Past Surgical History:  Procedure Laterality Date  . ADENOIDECTOMY  1957  . ADENOIDECTOMY     childhood    There were no vitals filed for this visit.    Subjective Assessment - 03/18/20 1956    Subjective Patient reports he is here today to be seen by her medical team for her newly diagnosed left breast cancer.    Patient is accompained by: [place] interpreter ([place] language)   Pertinent History Patient was diagnosed on 03/04/2020 with left grade II invasive ductal carcinoma with DCIS breast cancer. It measures 2.3 cm and is located in the upper inner quadrant. It is ER/PR positive and HER2 negative with a Ki67 of 25%. He has a positive axillary lymph node.    Patient Stated Goals Reduce lymphedema risk and learn post op shoulder ROM HEP    Currently in Pain? No/denies              [facility] PT Assessment - 03/18/20 0001      Assessment   Medical Diagnosis Left breast cancer    Referring Provider (PT) Dr. Marcello Moores Cornett    Onset Date/Surgical Date 03/04/20    Hand Dominance Right    Prior Therapy none      Precautions   Precautions Other (comment)    Precaution Comments active cancer      Restrictions   Weight Bearing  Restrictions No      Balance Screen   Has the patient fallen in the past 6 months No    Has the patient had a decrease in activity level because of a fear of falling?  No    Is the patient reluctant to leave their home because of a fear of falling?  No      Home Environment   Living Environment Private residence    Living Arrangements Alone    Available Help at Discharge Family      Prior Function   Level of [place] Retired    Leisure He does not exercise      Cognition   Overall Cognitive Status Within Functional Limits for tasks assessed      Posture/Postural Control   Posture/Postural Control Postural limitations    Postural Limitations Rounded Shoulders;Forward head      ROM / Strength   AROM / PROM / Strength AROM;Strength      AROM   Overall AROM Comments Left cervical rotation and left sidebending limited 25%; other cervical AROM is WNL    AROM Assessment Site Shoulder    Right/Left Shoulder Right;Left    Right Shoulder Extension 53 Degrees  Right Shoulder Flexion 142 Degrees    Right Shoulder ABduction 149 Degrees    Right Shoulder Internal Rotation 77 Degrees    Right Shoulder External Rotation 62 Degrees    Left Shoulder Extension 50 Degrees    Left Shoulder Flexion 129 Degrees    Left Shoulder ABduction 147 Degrees    Left Shoulder Internal Rotation 68 Degrees    Left Shoulder External Rotation 70 Degrees      Strength   Overall Strength Within functional limits for tasks performed             LYMPHEDEMA/ONCOLOGY QUESTIONNAIRE - 03/18/20 0001      Type   Cancer Type Left breast cancer      Lymphedema Assessments   Lymphedema Assessments Upper extremities      Right Upper Extremity Lymphedema   10 cm Proximal to Olecranon Process 26.4 cm    Olecranon Process 23.9 cm    10 cm Proximal to Ulnar Styloid Process 19.5 cm    Just Proximal to Ulnar Styloid Process 15 cm    Across Hand at [employer] 18.8 cm    At  [place] of 2nd Digit 6.4 cm      Left Upper Extremity Lymphedema   10 cm Proximal to Olecranon Process 25.9 cm    Olecranon Process 23.8 cm    10 cm Proximal to Ulnar Styloid Process 19.3 cm    Just Proximal to Ulnar Styloid Process 15 cm    Across Hand at [employer] 17.9 cm    At [place] of 2nd Digit 6.2 cm           L-DEX FLOWSHEETS - 03/18/20 2000      L-DEX LYMPHEDEMA SCREENING   Measurement Type Unilateral    L-DEX MEASUREMENT EXTREMITY Upper Extremity    POSITION  Standing    DOMINANT SIDE Right    At Risk Side Left    BASELINE SCORE (UNILATERAL) 3.3           The patient was assessed using the L-Dex machine today to produce a lymphedema index baseline score. The patient will be reassessed on a regular basis (typically every 3 months) to obtain new L-Dex scores. If the score is > 6.5 points away from his/her baseline score indicating onset of subclinical lymphedema, it will be recommended to wear a compression garment for 4 weeks, 12 hours per day and then be reassessed. If the score continues to be > 6.5 points from baseline at reassessment, we will initiate lymphedema treatment. Assessing in this manner has a 95% rate of preventing clinically significant lymphedema.      Katina Dung - 03/18/20 0001    Open a tight or new jar No difficulty    Do heavy household chores (wash walls, wash floors) No difficulty    Carry a shopping bag or briefcase No difficulty    Wash your back No difficulty    Use a knife to cut food No difficulty    Recreational activities in which you take some force or impact through your arm, shoulder, or hand (golf, hammering, tennis) No difficulty    During the past week, to what extent has your arm, shoulder or hand problem interfered with your normal social activities with family, friends, neighbors, or groups? Not at all    During the past week, to what extent has your arm, shoulder or hand problem limited your work or other regular daily  activities Not at all    Arm,  shoulder, or hand pain. None    Tingling (pins and needles) in your arm, shoulder, or hand None    Difficulty Sleeping No difficulty    DASH Score 0 %            Objective measurements completed on examination: See above findings.           Patient was instructed today in a home exercise program today for post op shoulder range of motion. These included active assist shoulder flexion in sitting, scapular retraction, wall walking with shoulder abduction, and hands behind head external rotation.  She was encouraged to do these twice a day, holding 3 seconds and repeating 5 times when permitted by her physician.        PT Education - 03/18/20 2007    Education Details Lymphedema risk reduction and post op shoulder ROM HEP    Person(s) Educated Patient    Methods Explanation;Demonstration;Handout    Comprehension Returned demonstration;Verbalized understanding               PT Long Term Goals - 03/18/20 2014      PT LONG TERM GOAL #1   Title Patient will demonstrate he has regained full shoulder ROM and function post operatively compared to baselines.    Time 8    Period Weeks    Status New    Target Date 05/13/20           Breast Clinic Goals - 03/18/20 2013      Patient will be able to verbalize understanding of pertinent lymphedema risk reduction practices relevant to her diagnosis specifically related to skin care.   Time 1    Period Days    Status Achieved      Patient will be able to return demonstrate and/or verbalize understanding of the post-op home exercise program related to regaining shoulder range of motion.   Time 1    Period Days    Status Achieved      Patient will be able to verbalize understanding of the importance of attending the postoperative After Breast Cancer Class for further lymphedema risk reduction education and therapeutic exercise.   Time 1    Period Days    Status Achieved                  Plan - 03/18/20 2009    Clinical Impression Statement Patient was diagnosed on 03/04/2020 with left grade II invasive ductal carcinoma with DCIS breast cancer. It measures 2.3 cm and is located in the upper inner quadrant. It is ER/PR positive and HER2 negative with a Ki67 of 25%. He has a positive axillary lymph node. His multidisciplinary medical team met prior to his assessments to determine a recommended treatment plan. He is planning to have a left mastectomy and targeted axillary node dissection followed by Mammaprint testing, radiation, and anti-estrogen therapy. He will benefit from a post op PT reassessment to determine needs and from L-Dex screens every 3 months for 2 years to detect subclinical lymphedema.    Stability/Clinical Decision Making Stable/Uncomplicated    Clinical Decision Making Low    Rehab Potential Excellent    PT Frequency --   Eval and 1 f/u visit   PT Treatment/Interventions ADLs/Self Care Home Management;Therapeutic exercise;Patient/family education    PT Next Visit Plan Will reassess 3-4 weeks post op to determine needs    PT Home Exercise Plan Post op shoulder ROM HEP    Consulted and Agree with Plan of Care Patient  Patient will benefit from skilled therapeutic intervention in order to improve the following deficits and impairments:  Postural dysfunction, Decreased range of motion, Decreased knowledge of precautions, Impaired UE functional use, Pain  Visit Diagnosis: Malignant neoplasm of upper-inner quadrant of left breast in male, estrogen receptor positive ([place]) - Plan: PT plan of care cert/re-cert  Abnormal posture - Plan: PT plan of care cert/re-cert   Patient will follow up at outpatient cancer rehab 3-4 weeks following surgery.  If the patient requires physical therapy at that time, a specific plan will be dictated and sent to the referring physician for approval. The patient was educated today on appropriate basic range of  motion exercises to begin post operatively and the importance of attending the After Breast Cancer class following surgery.  Patient was educated today on lymphedema risk reduction practices as it pertains to recommendations that will benefit the patient immediately following surgery.  She verbalized good understanding.      Problem List Patient Active Problem List   Diagnosis Date Noted  . Malignant neoplasm of upper-inner quadrant of left breast in male, estrogen receptor positive ([place]) 03/16/2020  . Gynecomastia 02/06/2020  . Elevated BP without diagnosis of hypertension 02/05/2019  . Lip lesion 01/25/2018  . Glaucoma 01/24/2017  . Cough 01/13/2016  . Palpitations 01/12/2015  . Prostate nodule 01/12/2015  . Benign cystic mucinous tumor 01/08/2013  . Sebaceous cyst 01/24/2012  . Well adult exam 03/28/2011   Annia Friendly, PT 03/18/20 8:16 PM  [place], [place], [address] Phone: [phone]   Fax:  [phone]  Name: Danny Gutierrez MRN: [id number] Date of Birth: [birth year]

## 2020-03-18 NOTE — H&P (View-Only) (Signed)
Danny Gutierrez Appointment: 03/18/2020 1:00 PM Location: [facility] Patient #: [id number] DOB: 25-Feb-1946 Undefined / Language: Danny Gutierrez / Race: White Male  History of Present Illness Danny Gutierrez A. [name] MD; 03/18/2020 2:37 PM) Patient words: Pt seen in the [facility] for left breast mass. Mammogram showed a2.3 cm mass UOQ core bx IDC / DCIS grade 2 er pos pr pos her 2 neu neg ki 25%. He has no complaints except mass in the breast for 3 months.  The patient is a 74 year old male.   Medication History Conni Slipper, RN; 03/18/2020 8:18 AM) Medications Reconciled     Review of Systems Danny Gutierrez A. Devonia Farro MD; 03/18/2020 2:37 PM) All other systems negative   Physical Exam (Rahil Passey A. Tanina Barb MD; 03/18/2020 2:44 PM)  General Mental Status-Alert. General Appearance-Consistent with stated age. Hydration-Well hydrated. Voice-Normal.  Head and Neck Head-normocephalic, atraumatic with no lesions or palpable masses.  Chest and Lung Exam Chest and lung exam reveals -quiet, even and easy respiratory effort with no use of accessory muscles and on auscultation, normal breath sounds, no adventitious sounds and normal vocal resonance. Inspection Chest Wall - Normal. Back - normal.  Breast Note: left breast UOQ 3 cm mobile mass  Cardiovascular Cardiovascular examination reveals -on palpation PMI is normal in location and amplitude, no palpable S3 or S4. Normal cardiac borders., normal heart sounds, regular rate and rhythm with no murmurs, carotid auscultation reveals no bruits and normal pedal pulses bilaterally.  Neurologic Neurologic evaluation reveals -alert and oriented x 3 with no impairment of recent or remote memory. Mental Status-Normal.  Lymphatic Axillary -Note:bruising left axilla no masses  right breast normal.     Assessment & Plan (Janashia Parco A. Mcdaniel Ohms MD; 03/18/2020 2:40 PM)  BREAST CANCER, LEFT (C50.912) Impression: left mastectomy with  targeted LN bx and SLN mapping Risk of sentinel lymph node mapping include bleeding, infection, lymphedema, shoulder pain. stiffness, dye allergy. cosmetic deformity , blood clots, death, need for more surgery. Pt agrees to proceed. Discussed treatment options for breast cancer to include breast conservation vs mastectomy with reconstruction. Pt has decided on mastectomy. Risk include bleeding, infection, flap necrosis, pain, numbness, recurrence, hematoma, other surgery needs. Pt understands and agrees to proceed.  total time 45 min  Current Plans Pt Education - [facility] Mastectomy HCI Pt Education - [facility] Breast Cancer Information Given - [facility] "Breast Journey" Package You are being scheduled for surgery- Our schedulers will call you.  You should hear from our office's scheduling department within 5 working days about the location, date, and time of surgery. We try to make accommodations for patient's preferences in scheduling surgery, but sometimes the OR schedule or the surgeon's schedule prevents [place] from making those accommodations.  If you have not heard from our office [phone] in 5 working days, call the office and ask for your surgeon's nurse.  If you have other questions about your diagnosis, plan, or surgery, call the office and ask for your surgeon's nurse.  Pt Education - flb breast cancer surgery: discussed with patient and provided information.

## 2020-03-18 NOTE — Patient Instructions (Signed)

## 2020-03-19 ENCOUNTER — Encounter: Payer: Self-pay | Admitting: Genetic Counselor

## 2020-03-19 ENCOUNTER — Encounter: Payer: Self-pay | Admitting: General Practice

## 2020-03-19 ENCOUNTER — Telehealth: Payer: Self-pay | Admitting: Hematology and Oncology

## 2020-03-19 DIAGNOSIS — Z803 Family history of malignant neoplasm of breast: Secondary | ICD-10-CM | POA: Insufficient documentation

## 2020-03-19 NOTE — Telephone Encounter (Signed)
No 9/29 los, no changes made to pt schedule

## 2020-03-19 NOTE — Progress Notes (Signed)
[place] Psychosocial Distress Screening Spiritual Care  Met with Danny Gutierrez briefly in [facility], and then left a follow-up voicemail with assistance from [facility] [phone]), to introduce [place] team/resources, reviewing distress screen per protocol.  The patient scored a 4 on the Psychosocial Distress Thermometer which indicates moderate distress. Also assessed for distress and other psychosocial needs.   ONCBCN DISTRESS SCREENING 03/19/2020  Screening Type Initial Screening  Distress experienced in past week (1-10) 4  Emotional problem type Nervousness/Anxiety  Information Concerns Type Lack of info about diagnosis;Lack of info about treatment;Lack of info about complementary therapy choices;Lack of info about maintaining fitness  Referral to support programs Yes   Danny Gutierrez reports that his "unusual cancer is much more stressful than having a benign cyst" years ago. So far, this distress seems his top concern.   Follow up needed: Yes.  Will refer to interim chaplains to follow up for spiritual/emotional support (while I am on leave in October).   [place], [place], [facility] Pager [phone] Voicemail [phone]

## 2020-03-20 ENCOUNTER — Encounter: Payer: Self-pay | Admitting: *Deleted

## 2020-03-20 NOTE — Progress Notes (Signed)
REFERRING PROVIDER: Nicholas Lose, MD [place],  [place] [address]  PRIMARY PROVIDER:  Plotnikov, Evie Lacks, MD  PRIMARY REASON FOR VISIT:  1. Malignant neoplasm of upper-inner quadrant of left breast in male, estrogen receptor positive ([place])   2. Family history of breast cancer    I connected with Mr. Savastano on 03/18/2020 at 3pm EDT by Webex video conference and verified that I am speaking with the correct person using two identifiers.   Patient location: [facility] Provider location: [place] OF PRESENT ILLNESS:   Mr. Mickley, a 74 y.o. male, was seen for a [place] cancer genetics consultation during the breast multidisciplinary clinic at the request of Dr. Lindi Adie due to a personal and family history of breast cancer.  Mr. Brearley presents to clinic today with a [place] interpreter to discuss the possibility of a hereditary predisposition to cancer, to discuss genetic testing, and to further clarify his future cancer risks, as well as potential cancer risks for family members.   In September 2021, at the age of 38, Mr. Petrey was diagnosed with invasive ductal carcinoma with ductal carcinoma in situ of the right breast (ER+/PR+/HER2-). The preliminary treatment plan includes breast conserving surgery, MammaPrint testing, and adjuvant anti-estrogen therapy.   CANCER HISTORY:  Oncology History  Malignant neoplasm of upper-inner quadrant of left breast in male, estrogen receptor positive ([place])  03/11/2020 Initial Diagnosis   Patient palpated a left breast mass. Diagnostic mammogram and US showed no right breast malignancy and a 3.0cm mass in the upper inner quadrant of the left breast with a promnent left axillary lymph node. Biopsy showed invasive mammary carcinoma in the breast and axilla, grade 2, HER-2 equivocal by IHC (2+), ER/PR+ >95%, Ki67 25%.    RISK FACTORS:  Colonoscopy: not reported Prostate Cancer  Screening: PSA in 2018; normal   Past Medical History:  Diagnosis Date   Breast cancer ([place])    Family history of breast cancer     Past Surgical History:  Procedure Laterality Date   ADENOIDECTOMY  1957   ADENOIDECTOMY     childhood    Social History   Socioeconomic History   Marital status: Legally Separated    Spouse name: Not on file   Number of children: Not on file   Years of education: Not on file   Highest education level: Not on file  Occupational History   Not on file  Tobacco Use   Smoking status: Never Smoker   Smokeless tobacco: Never Used  Substance and Sexual Activity   Alcohol use: No   Drug use: No   Sexual activity: Not Currently  Other Topics Concern   Not on file  Social History Narrative   Not on file   Social Determinants of Health   Financial Resource Strain:    Difficulty of Paying Living Expenses: Not on file  Food Insecurity:    Worried About [place] in the Last Year: Not on file   Ran Out of Food in the Last Year: Not on file  Transportation Needs:    Lack of Transportation (Medical): Not on file   Lack of Transportation (Non-Medical): Not on file  Physical Activity:    Days of Exercise per Week: Not on file   Minutes of Exercise per Session: Not on file  Stress:    Feeling of Stress : Not on file  Social Connections:    Frequency of Communication with Friends and Family: Not on file  Frequency of Social Gatherings with Friends and Family: Not on file   Attends Religious Services: Not on file   Active Member of Clubs or Organizations: Not on file   Attends Archivist Meetings: Not on file   Marital Status: Not on file     FAMILY HISTORY:  We obtained a detailed, 4-generation family history.  Significant diagnoses are listed below: Family History  Problem Relation Age of Onset   Breast cancer Mother 72   Heart disease Father 27       ?CAD    Mr. Rossitto has one  son without a cancer history.  His mother was diagnosed with breast cancer at age 68 and passed away at age 58.  Mr. Magoffin father passed away at age 67 and did not have cancer. Mr.Scroggins had limited information about his maternal and paternal family members.    Mr. Hanshaw is unaware of previous family history of genetic testing for hereditary cancer risks. Patient's maternal ancestors are of [place] descent, and paternal ancestors are of [place] descent. There is reported Ashkenazi Jewish ancestry. There is no known consanguinity.  GENETIC COUNSELING ASSESSMENT: Mr. Poehler is a 74 y.o. male with a personal and family history of breast cancer which is somewhat suggestive of a hereditary cancer syndrome and predisposition to cancer given the presence of the same cancers in multiple generations of the family, male breast cancer present in the patient, and Ashkenazi Jewish ancestry. We, therefore, discussed and recommended the following at today's visit.   DISCUSSION: We discussed that 5 - 10% of cancer is hereditary, with most cases of breast cancer associated with mutations in BRCA1 and BRCA2.  Those with mutations of Ashkenazi Jewish ancestry have a higher chance of having mutations in BRCA1 and BRCA2 compared to those in the general population.  Thee rare other genes that can be associated with hereditary breast cancer syndromes.  Type of cancer risk and level of risk are gene-specific.   We discussed that testing is beneficial for several reasons including knowing how to follow individuals after completing their treatment, identifying whether potential treatment options such as PARP inhibitors would be beneficial, and understand if other family members could be at risk for cancer and allow them to undergo genetic testing.   We reviewed the characteristics, features and inheritance patterns of hereditary cancer syndromes. We also discussed genetic testing, including the  appropriate family members to test, the process of testing, insurance coverage and turn-around-time for results. We discussed the implications of a negative, positive, carrier and/or variant of uncertain significant result. We recommended Mr. Arocho pursue genetic testing for a panel that includes genes associated with breast cancer.   Based on Mr. Sherley's personal and family history of breast cancer, he meets NCCN medical criteria for genetic testing. Despite that he meets criteria, he may still have an out of pocket cost.    PLAN:  Despite our recommendation, Mr. Vannote did not wish to pursue genetic testing at today's visit.  He stated that he may wish to consider this testing in the future. We remain available to coordinate genetic testing at any time in the future. We, therefore, recommend Mr. Dorsch continue to follow the cancer screening guidelines given by his primary healthcare provider.  Lastly, we encouraged Mr. Lindblad to remain in contact with cancer genetics annually so that we can continuously update the family history and inform him of any changes in cancer genetics and testing that may be of benefit for this family.  Mr. Brideau questions were answered to his satisfaction today. Our contact information was provided should additional questions or concerns arise. Thank you for the referral and allowing [place] to share in the care of your patient.   Demyah Smyre M. Joette Catching, [place], [facility] Certified [email or web address] [email or web address] (P) [phone]  The patient was seen for a total of 15 minutes in televideo genetic counseling.  This patient was discussed with Drs. Magrinat, Lindi Adie and/or Burr Medico who agrees with the above.   _______________________________________________________________ For Office Staff:  Number of people involved in session: 1 Was an Intern/ student involved with case: no

## 2020-03-23 ENCOUNTER — Other Ambulatory Visit: Payer: Self-pay | Admitting: Surgery

## 2020-03-23 DIAGNOSIS — C50912 Malignant neoplasm of unspecified site of left female breast: Secondary | ICD-10-CM

## 2020-03-24 ENCOUNTER — Telehealth: Payer: Self-pay | Admitting: *Deleted

## 2020-03-24 ENCOUNTER — Telehealth: Payer: Self-pay | Admitting: Hematology and Oncology

## 2020-03-24 ENCOUNTER — Encounter: Payer: Self-pay | Admitting: *Deleted

## 2020-03-24 NOTE — Telephone Encounter (Signed)
Spoke to pt, confirmed future appts. Gave directions and instructions on staging scans and contrast. No further needs voiced.

## 2020-03-24 NOTE — Telephone Encounter (Signed)
Per 10/5 scheduling message appointment was scheduled 10/21 at 10:15. Interpreter line left message with appointment information

## 2020-03-24 NOTE — Progress Notes (Signed)
Nutrition  Patient identified by attending Breast Clinic on 03/18/20.  Patient was given nutrition packet with RD contact information by nurse navigator.   Chart reviewed.   74 year old male with left breast cancer.  Planning mastectomy, mammaprint, adjuvant radiation and antiestrogens.    Ht: 66 inches Wt: 159 lb BMI 25  Patient is currently not at nutritional risk.  Please consult RD if changes in nutritional status occur.  Nelida Mandarino B. Zenia Resides, [place], [place] Registered Dietitian [phone] (mobile)

## 2020-03-24 NOTE — Telephone Encounter (Signed)
Left vm regarding [facility] from 9.29.21. Contact information provided for questions or needs.

## 2020-03-27 ENCOUNTER — Encounter (HOSPITAL_COMMUNITY)
Admission: RE | Admit: 2020-03-27 | Discharge: 2020-03-27 | Disposition: A | Discharge status: Home / Self Care | Payer: PPO | Source: Ambulatory Visit | Attending: Hematology and Oncology | Admitting: Hematology and Oncology

## 2020-03-27 ENCOUNTER — Other Ambulatory Visit: Payer: Self-pay

## 2020-03-27 ENCOUNTER — Encounter (HOSPITAL_BASED_OUTPATIENT_CLINIC_OR_DEPARTMENT_OTHER): Payer: Self-pay | Admitting: Surgery

## 2020-03-27 DIAGNOSIS — Z170 Estrogen receptor positive status [ER+]: Secondary | ICD-10-CM | POA: Insufficient documentation

## 2020-03-27 DIAGNOSIS — C50222 Malignant neoplasm of upper-inner quadrant of left male breast: Secondary | ICD-10-CM | POA: Insufficient documentation

## 2020-03-27 DIAGNOSIS — C50919 Malignant neoplasm of unspecified site of unspecified female breast: Secondary | ICD-10-CM | POA: Diagnosis not present

## 2020-03-27 MED ORDER — TECHNETIUM TC 99M MEDRONATE IV KIT
22.0000 | PACK | Freq: Once | INTRAVENOUS | Status: AC
  Administered 2020-03-27: 22 via INTRAVENOUS

## 2020-03-30 ENCOUNTER — Telehealth: Payer: Self-pay | Admitting: *Deleted

## 2020-03-30 ENCOUNTER — Encounter: Payer: Self-pay | Admitting: *Deleted

## 2020-03-30 ENCOUNTER — Other Ambulatory Visit: Payer: Self-pay | Admitting: *Deleted

## 2020-03-30 ENCOUNTER — Other Ambulatory Visit (HOSPITAL_COMMUNITY)
Admission: RE | Admit: 2020-03-30 | Discharge: 2020-03-30 | Disposition: A | Discharge status: Home / Self Care | Payer: PPO | Source: Ambulatory Visit | Attending: Surgery | Admitting: Surgery

## 2020-03-30 DIAGNOSIS — Z01812 Encounter for preprocedural laboratory examination: Secondary | ICD-10-CM | POA: Insufficient documentation

## 2020-03-30 DIAGNOSIS — Z20822 Contact with and (suspected) exposure to covid-19: Secondary | ICD-10-CM | POA: Insufficient documentation

## 2020-03-30 DIAGNOSIS — C50212 Malignant neoplasm of upper-inner quadrant of left female breast: Secondary | ICD-10-CM

## 2020-03-30 LAB — SARS CORONAVIRUS 2 (TAT 6-24 HRS): SARS Coronavirus 2: NEGATIVE

## 2020-03-30 NOTE — Telephone Encounter (Signed)
Spoke to pt concerning need for MRI of pelvis per Dr. Lindi Adie recommendations. Received verbal understanding. Confirmed further appts. No further needs voiced.

## 2020-03-30 NOTE — Progress Notes (Signed)

## 2020-04-01 ENCOUNTER — Ambulatory Visit (HOSPITAL_COMMUNITY)
Admission: RE | Admit: 2020-04-01 | Discharge: 2020-04-01 | Disposition: A | Discharge status: Home / Self Care | Payer: PPO | Source: Ambulatory Visit | Attending: Hematology and Oncology | Admitting: Hematology and Oncology

## 2020-04-01 ENCOUNTER — Encounter (HOSPITAL_COMMUNITY): Payer: Self-pay

## 2020-04-01 ENCOUNTER — Other Ambulatory Visit: Payer: Self-pay

## 2020-04-01 DIAGNOSIS — Z170 Estrogen receptor positive status [ER+]: Secondary | ICD-10-CM

## 2020-04-01 DIAGNOSIS — C50222 Malignant neoplasm of upper-inner quadrant of left male breast: Secondary | ICD-10-CM | POA: Insufficient documentation

## 2020-04-01 MED ORDER — IOHEXOL 300 MG/ML  SOLN
100.0000 mL | Freq: Once | INTRAMUSCULAR | Status: AC | PRN
  Administered 2020-04-01: 100 mL via INTRAVENOUS

## 2020-04-01 NOTE — Progress Notes (Signed)
e-mail sent to [employer]:   Requesting [employer] Interpreter for Danny Gutierrez   [facility] [name]  Patient  [name]  DOB  [birth year]  MRN   [id number]  day of surgery  04/02/2020 arrival time  1000 OR time 1000-1200 PACU  1330-1500.   Procedure is Left Breast Mastectomy for breast cancer.

## 2020-04-02 ENCOUNTER — Encounter (HOSPITAL_BASED_OUTPATIENT_CLINIC_OR_DEPARTMENT_OTHER): Payer: Self-pay | Admitting: Surgery

## 2020-04-02 ENCOUNTER — Ambulatory Visit (HOSPITAL_COMMUNITY)
Admission: RE | Admit: 2020-04-02 | Discharge: 2020-04-02 | Disposition: A | Discharge status: Home / Self Care | Payer: PPO | Source: Ambulatory Visit | Attending: Surgery | Admitting: Surgery

## 2020-04-02 ENCOUNTER — Other Ambulatory Visit: Payer: Self-pay | Admitting: Surgery

## 2020-04-02 ENCOUNTER — Ambulatory Visit (HOSPITAL_BASED_OUTPATIENT_CLINIC_OR_DEPARTMENT_OTHER): Payer: PPO | Admitting: Certified Registered"

## 2020-04-02 ENCOUNTER — Ambulatory Visit
Admission: RE | Admit: 2020-04-02 | Discharge: 2020-04-02 | Disposition: A | Discharge status: Home / Self Care | Payer: PPO | Source: Ambulatory Visit | Attending: Surgery | Admitting: Surgery

## 2020-04-02 ENCOUNTER — Encounter (HOSPITAL_BASED_OUTPATIENT_CLINIC_OR_DEPARTMENT_OTHER)
Admission: RE | Disposition: A | Discharge status: Home / Self Care | Payer: Self-pay | Source: Home / Self Care | Attending: Surgery

## 2020-04-02 ENCOUNTER — Ambulatory Visit (HOSPITAL_BASED_OUTPATIENT_CLINIC_OR_DEPARTMENT_OTHER)
Admission: RE | Admit: 2020-04-02 | Discharge: 2020-04-03 | Disposition: A | Discharge status: Home / Self Care | Payer: PPO | Attending: Surgery | Admitting: Surgery

## 2020-04-02 ENCOUNTER — Other Ambulatory Visit: Payer: Self-pay

## 2020-04-02 DIAGNOSIS — C50912 Malignant neoplasm of unspecified site of left female breast: Secondary | ICD-10-CM | POA: Diagnosis not present

## 2020-04-02 DIAGNOSIS — C50422 Malignant neoplasm of upper-outer quadrant of left male breast: Secondary | ICD-10-CM | POA: Diagnosis not present

## 2020-04-02 DIAGNOSIS — C50222 Malignant neoplasm of upper-inner quadrant of left male breast: Secondary | ICD-10-CM | POA: Diagnosis not present

## 2020-04-02 DIAGNOSIS — Z803 Family history of malignant neoplasm of breast: Secondary | ICD-10-CM | POA: Diagnosis not present

## 2020-04-02 DIAGNOSIS — C773 Secondary and unspecified malignant neoplasm of axilla and upper limb lymph nodes: Secondary | ICD-10-CM | POA: Insufficient documentation

## 2020-04-02 DIAGNOSIS — C801 Malignant (primary) neoplasm, unspecified: Secondary | ICD-10-CM | POA: Diagnosis not present

## 2020-04-02 DIAGNOSIS — Z170 Estrogen receptor positive status [ER+]: Secondary | ICD-10-CM | POA: Diagnosis not present

## 2020-04-02 DIAGNOSIS — G8918 Other acute postprocedural pain: Secondary | ICD-10-CM | POA: Diagnosis not present

## 2020-04-02 DIAGNOSIS — H409 Unspecified glaucoma: Secondary | ICD-10-CM | POA: Diagnosis not present

## 2020-04-02 DIAGNOSIS — R928 Other abnormal and inconclusive findings on diagnostic imaging of breast: Secondary | ICD-10-CM | POA: Diagnosis not present

## 2020-04-02 DIAGNOSIS — C50922 Malignant neoplasm of unspecified site of left male breast: Secondary | ICD-10-CM | POA: Diagnosis not present

## 2020-04-02 HISTORY — PX: MASTECTOMY W/ SENTINEL NODE BIOPSY: SHX2001

## 2020-04-02 SURGERY — MASTECTOMY WITH SENTINEL LYMPH NODE BIOPSY
Anesthesia: General | Site: Breast | Laterality: Left

## 2020-04-02 MED ORDER — ACETAMINOPHEN 325 MG PO TABS
650.0000 mg | ORAL_TABLET | ORAL | Status: DC | PRN
  Administered 2020-04-02: 650 mg via ORAL
  Filled 2020-04-02: qty 2

## 2020-04-02 MED ORDER — ACETAMINOPHEN 500 MG PO TABS
ORAL_TABLET | ORAL | Status: AC
  Filled 2020-04-02: qty 2

## 2020-04-02 MED ORDER — LACTATED RINGERS IV SOLN: INTRAVENOUS | Status: DC

## 2020-04-02 MED ORDER — OXYCODONE HCL 5 MG PO TABS: 5.0000 mg | ORAL_TABLET | ORAL | Status: DC | PRN

## 2020-04-02 MED ORDER — ONDANSETRON HCL 4 MG/2ML IJ SOLN: 4.0000 mg | Freq: Once | INTRAMUSCULAR | Status: DC | PRN

## 2020-04-02 MED ORDER — CHLORHEXIDINE GLUCONATE CLOTH 2 % EX PADS: 6.0000 | MEDICATED_PAD | Freq: Once | CUTANEOUS | Status: DC

## 2020-04-02 MED ORDER — FENTANYL CITRATE (PF) 100 MCG/2ML IJ SOLN
INTRAMUSCULAR | Status: AC
  Filled 2020-04-02: qty 2

## 2020-04-02 MED ORDER — ACETAMINOPHEN 500 MG PO TABS
1000.0000 mg | ORAL_TABLET | ORAL | Status: AC
  Administered 2020-04-02: 1000 mg via ORAL

## 2020-04-02 MED ORDER — SODIUM CHLORIDE 0.9 % IV SOLN: 250.0000 mL | INTRAVENOUS | Status: DC | PRN

## 2020-04-02 MED ORDER — DIPHENHYDRAMINE HCL 25 MG PO CAPS
25.0000 mg | ORAL_CAPSULE | Freq: Four times a day (QID) | ORAL | Status: DC | PRN
  Administered 2020-04-02: 25 mg via ORAL
  Filled 2020-04-02: qty 1

## 2020-04-02 MED ORDER — BUPIVACAINE-EPINEPHRINE (PF) 0.5% -1:200000 IJ SOLN
INTRAMUSCULAR | Status: DC | PRN
  Administered 2020-04-02: 30 mL via PERINEURAL

## 2020-04-02 MED ORDER — HYDROCODONE-ACETAMINOPHEN 5-325 MG PO TABS: 1.0000 | ORAL_TABLET | Freq: Four times a day (QID) | ORAL | 0 refills | Status: DC | PRN

## 2020-04-02 MED ORDER — TECHNETIUM TC 99M SULFUR COLLOID FILTERED
1.0000 | Freq: Once | INTRAVENOUS | Status: AC | PRN
  Administered 2020-04-02: 1 via INTRADERMAL

## 2020-04-02 MED ORDER — GABAPENTIN 300 MG PO CAPS
300.0000 mg | ORAL_CAPSULE | ORAL | Status: AC
  Administered 2020-04-02: 300 mg via ORAL

## 2020-04-02 MED ORDER — METHYLENE BLUE 0.5 % INJ SOLN
INTRAVENOUS | Status: AC
  Filled 2020-04-02: qty 10

## 2020-04-02 MED ORDER — FENTANYL CITRATE (PF) 100 MCG/2ML IJ SOLN: 25.0000 ug | INTRAMUSCULAR | Status: DC | PRN

## 2020-04-02 MED ORDER — FENTANYL CITRATE (PF) 100 MCG/2ML IJ SOLN
100.0000 ug | Freq: Once | INTRAMUSCULAR | Status: AC
  Administered 2020-04-02: 100 ug via INTRAVENOUS

## 2020-04-02 MED ORDER — CEFAZOLIN SODIUM-DEXTROSE 2-4 GM/100ML-% IV SOLN
INTRAVENOUS | Status: AC
  Filled 2020-04-02: qty 100

## 2020-04-02 MED ORDER — DEXAMETHASONE SODIUM PHOSPHATE 10 MG/ML IJ SOLN
INTRAMUSCULAR | Status: AC
  Filled 2020-04-02: qty 1

## 2020-04-02 MED ORDER — ONDANSETRON HCL 4 MG/2ML IJ SOLN
INTRAMUSCULAR | Status: DC | PRN
  Administered 2020-04-02: 4 mg via INTRAVENOUS

## 2020-04-02 MED ORDER — SODIUM CHLORIDE 0.9% FLUSH: 3.0000 mL | INTRAVENOUS | Status: DC | PRN

## 2020-04-02 MED ORDER — SODIUM CHLORIDE (PF) 0.9 % IJ SOLN
INTRAMUSCULAR | Status: AC
  Filled 2020-04-02: qty 10

## 2020-04-02 MED ORDER — CELECOXIB 200 MG PO CAPS
200.0000 mg | ORAL_CAPSULE | ORAL | Status: AC
  Administered 2020-04-02: 200 mg via ORAL

## 2020-04-02 MED ORDER — ACETAMINOPHEN 325 MG RE SUPP: 650.0000 mg | RECTAL | Status: DC | PRN

## 2020-04-02 MED ORDER — GABAPENTIN 300 MG PO CAPS
ORAL_CAPSULE | ORAL | Status: AC
  Filled 2020-04-02: qty 1

## 2020-04-02 MED ORDER — FENTANYL CITRATE (PF) 250 MCG/5ML IJ SOLN
INTRAMUSCULAR | Status: DC | PRN
Start: 2020-04-02 — End: 2020-04-02
  Administered 2020-04-02 (×2): 25 ug via INTRAVENOUS

## 2020-04-02 MED ORDER — DEXAMETHASONE SODIUM PHOSPHATE 10 MG/ML IJ SOLN
INTRAMUSCULAR | Status: DC | PRN
  Administered 2020-04-02: 5 mg via INTRAVENOUS

## 2020-04-02 MED ORDER — LIDOCAINE 2% (20 MG/ML) 5 ML SYRINGE
INTRAMUSCULAR | Status: AC
  Filled 2020-04-02: qty 5

## 2020-04-02 MED ORDER — PROPOFOL 10 MG/ML IV BOLUS
INTRAVENOUS | Status: AC
  Filled 2020-04-02: qty 20

## 2020-04-02 MED ORDER — CLONIDINE HCL (ANALGESIA) 100 MCG/ML EP SOLN
EPIDURAL | Status: DC | PRN
  Administered 2020-04-02: 50 ug

## 2020-04-02 MED ORDER — LIDOCAINE 2% (20 MG/ML) 5 ML SYRINGE
INTRAMUSCULAR | Status: DC | PRN
  Administered 2020-04-02: 60 mg via INTRAVENOUS

## 2020-04-02 MED ORDER — SODIUM CHLORIDE 0.9% FLUSH
3.0000 mL | Freq: Two times a day (BID) | INTRAVENOUS | Status: DC
  Administered 2020-04-02: 3 mL via INTRAVENOUS

## 2020-04-02 MED ORDER — MIDAZOLAM HCL 2 MG/2ML IJ SOLN
2.0000 mg | Freq: Once | INTRAMUSCULAR | Status: AC
  Administered 2020-04-02: 1 mg via INTRAVENOUS

## 2020-04-02 MED ORDER — MIDAZOLAM HCL 2 MG/2ML IJ SOLN
INTRAMUSCULAR | Status: AC
  Filled 2020-04-02: qty 2

## 2020-04-02 MED ORDER — IBUPROFEN 800 MG PO TABS: 800.0000 mg | ORAL_TABLET | Freq: Three times a day (TID) | ORAL | 0 refills | Status: DC | PRN

## 2020-04-02 MED ORDER — CELECOXIB 200 MG PO CAPS
ORAL_CAPSULE | ORAL | Status: AC
  Filled 2020-04-02: qty 1

## 2020-04-02 MED ORDER — CEFAZOLIN SODIUM-DEXTROSE 2-4 GM/100ML-% IV SOLN
2.0000 g | INTRAVENOUS | Status: AC
  Administered 2020-04-02: 2 g via INTRAVENOUS

## 2020-04-02 MED ORDER — ONDANSETRON HCL 4 MG/2ML IJ SOLN
INTRAMUSCULAR | Status: AC
  Filled 2020-04-02: qty 2

## 2020-04-02 MED ORDER — PROPOFOL 10 MG/ML IV BOLUS
INTRAVENOUS | Status: DC | PRN
  Administered 2020-04-02: 150 mg via INTRAVENOUS

## 2020-04-02 SURGICAL SUPPLY — 71 items
APPLIER CLIP 11 MED OPEN (CLIP)
APPLIER CLIP 9.375 MED OPEN (MISCELLANEOUS) ×3
BENZOIN TINCTURE PRP APPL 2/3 (GAUZE/BANDAGES/DRESSINGS) IMPLANT
BINDER BREAST LRG (GAUZE/BANDAGES/DRESSINGS) ×3 IMPLANT
BINDER BREAST MEDIUM (GAUZE/BANDAGES/DRESSINGS) IMPLANT
BINDER BREAST XLRG (GAUZE/BANDAGES/DRESSINGS) IMPLANT
BINDER BREAST XXLRG (GAUZE/BANDAGES/DRESSINGS) IMPLANT
BIOPATCH RED 1 DISK 7.0 (GAUZE/BANDAGES/DRESSINGS) ×2 IMPLANT
BIOPATCH RED 1IN DISK 7.0MM (GAUZE/BANDAGES/DRESSINGS) ×1
BLADE CLIPPER SURG (BLADE) ×3 IMPLANT
BLADE HEX COATED 2.75 (ELECTRODE) ×3 IMPLANT
BLADE SURG 10 STRL SS (BLADE) ×3 IMPLANT
BLADE SURG 15 STRL LF DISP TIS (BLADE) ×1 IMPLANT
BLADE SURG 15 STRL SS (BLADE) ×2
CANISTER SUCT 1200ML W/VALVE (MISCELLANEOUS) ×3 IMPLANT
CHLORAPREP W/TINT 26 (MISCELLANEOUS) ×3 IMPLANT
CLIP APPLIE 11 MED OPEN (CLIP) IMPLANT
CLIP APPLIE 9.375 MED OPEN (MISCELLANEOUS) ×1 IMPLANT
CLOSURE WOUND 1/2 X4 (GAUZE/BANDAGES/DRESSINGS)
COVER BACK TABLE 60X90IN (DRAPES) ×3 IMPLANT
COVER MAYO STAND STRL (DRAPES) ×3 IMPLANT
COVER PROBE W GEL 5X96 (DRAPES) ×3 IMPLANT
COVER WAND RF STERILE (DRAPES) IMPLANT
DECANTER SPIKE VIAL GLASS SM (MISCELLANEOUS) IMPLANT
DERMABOND ADVANCED (GAUZE/BANDAGES/DRESSINGS) ×8
DERMABOND ADVANCED .7 DNX12 (GAUZE/BANDAGES/DRESSINGS) ×4 IMPLANT
DRAIN CHANNEL 19F RND (DRAIN) ×3 IMPLANT
DRAPE LAPAROSCOPIC ABDOMINAL (DRAPES) ×3 IMPLANT
DRAPE UTILITY XL STRL (DRAPES) IMPLANT
DRSG PAD ABDOMINAL 8X10 ST (GAUZE/BANDAGES/DRESSINGS) ×6 IMPLANT
DRSG TEGADERM 4X10 (GAUZE/BANDAGES/DRESSINGS) ×3 IMPLANT
ELECT REM PT RETURN 9FT ADLT (ELECTROSURGICAL) ×3
ELECTRODE REM PT RTRN 9FT ADLT (ELECTROSURGICAL) ×1 IMPLANT
EVACUATOR SILICONE 100CC (DRAIN) ×3 IMPLANT
GAUZE SPONGE 4X4 12PLY STRL LF (GAUZE/BANDAGES/DRESSINGS) IMPLANT
GLOVE BIOGEL PI IND STRL 7.0 (GLOVE) ×1 IMPLANT
GLOVE BIOGEL PI IND STRL 8 (GLOVE) ×1 IMPLANT
GLOVE BIOGEL PI INDICATOR 7.0 (GLOVE) ×2
GLOVE BIOGEL PI INDICATOR 8 (GLOVE) ×2
GLOVE ECLIPSE 6.5 STRL STRAW (GLOVE) ×3 IMPLANT
GLOVE ECLIPSE 8.0 STRL XLNG CF (GLOVE) ×3 IMPLANT
GOWN STRL REUS W/ TWL LRG LVL3 (GOWN DISPOSABLE) ×2 IMPLANT
GOWN STRL REUS W/TWL LRG LVL3 (GOWN DISPOSABLE) ×4
HEMOSTAT ARISTA ABSORB 1G (HEMOSTASIS) ×12 IMPLANT
LIGHT WAVEGUIDE WIDE FLAT (MISCELLANEOUS) IMPLANT
NDL SAFETY ECLIPSE 18X1.5 (NEEDLE) IMPLANT
NEEDLE HYPO 18GX1.5 SHARP (NEEDLE)
NEEDLE HYPO 25X1 1.5 SAFETY (NEEDLE) IMPLANT
NS IRRIG 1000ML POUR BTL (IV SOLUTION) ×3 IMPLANT
PACK BASIN DAY SURGERY FS (CUSTOM PROCEDURE TRAY) ×3 IMPLANT
PENCIL SMOKE EVACUATOR (MISCELLANEOUS) ×3 IMPLANT
PIN SAFETY STERILE (MISCELLANEOUS) ×3 IMPLANT
SLEEVE SCD COMPRESS KNEE MED (MISCELLANEOUS) ×3 IMPLANT
SPONGE LAP 18X18 RF (DISPOSABLE) ×6 IMPLANT
SPONGE LAP 4X18 RFD (DISPOSABLE) IMPLANT
STAPLER VISISTAT 35W (STAPLE) ×3 IMPLANT
STRIP CLOSURE SKIN 1/2X4 (GAUZE/BANDAGES/DRESSINGS) IMPLANT
SUT CHROMIC 3 0 SH 27 (SUTURE) IMPLANT
SUT ETHILON 2 0 FS 18 (SUTURE) ×3 IMPLANT
SUT MNCRL AB 3-0 PS2 18 (SUTURE) ×3 IMPLANT
SUT MON AB 4-0 PC3 18 (SUTURE) IMPLANT
SUT SILK 2 0 PERMA HAND 18 BK (SUTURE) ×3 IMPLANT
SUT SILK 2 0 SH (SUTURE) IMPLANT
SUT VIC AB 3-0 54X BRD REEL (SUTURE) ×1 IMPLANT
SUT VIC AB 3-0 BRD 54 (SUTURE) ×2
SUT VICRYL 3-0 CR8 SH (SUTURE) ×6 IMPLANT
SYR CONTROL 10ML LL (SYRINGE) IMPLANT
TOWEL GREEN STERILE FF (TOWEL DISPOSABLE) ×6 IMPLANT
TUBE CONNECTING 20'X1/4 (TUBING) ×1
TUBE CONNECTING 20X1/4 (TUBING) ×2 IMPLANT
YANKAUER SUCT BULB TIP NO VENT (SUCTIONS) ×3 IMPLANT

## 2020-04-02 NOTE — Interval H&P Note (Signed)
History and Physical Interval Note:  04/02/2020 12:20 PM  Danny Gutierrez  has presented today for surgery, with the diagnosis of LEFT BREAST CANCER.  The various methods of treatment have been discussed with the patient and family. After consideration of risks, benefits and other options for treatment, the patient has consented to  Procedure(s) with comments: LEFT MASTECTOMY WITH LEFT TARGETED RADIOACTIVE SEED  LYMPH NODE BIOPSY AND LEFT SENTINEL LYMPH NODE MAPPING (Left) - PEC BLOCK, RNFA as a surgical intervention.  The patient's history has been reviewed, patient examined, no change in status, stable for surgery.  I have reviewed the patient's chart and labs.  Questions were answered to the patient's satisfaction.     [place]

## 2020-04-02 NOTE — Anesthesia Procedure Notes (Signed)
Procedure Name: LMA Insertion Date/Time: 04/02/2020 12:50 PM Performed by: Myna Bright, CRNA Pre-anesthesia Checklist: Patient identified, Emergency Drugs available, Suction available and Patient being monitored Patient Re-evaluated:Patient Re-evaluated prior to induction Oxygen Delivery Method: Circle system utilized Preoxygenation: Pre-oxygenation with 100% oxygen Induction Type: IV induction Ventilation: Mask ventilation without difficulty LMA: LMA inserted LMA Size: 4.0 Tube type: Oral Number of attempts: 1 Placement Confirmation: positive ETCO2 and breath sounds checked- equal and bilateral Tube secured with: Tape Dental Injury: Teeth and Oropharynx as per pre-operative assessment

## 2020-04-02 NOTE — Progress Notes (Signed)
nuc med inj performed by nuc med staff. Pt tol well with additional fentanyl for sedation. Interpreter at bedside assisting with communication. VSS. See flowsheet. Emotional support provided

## 2020-04-02 NOTE — Progress Notes (Signed)
Assisted Dr. Turk with left, ultrasound guided, pectoralis block. Side rails up, monitors on throughout procedure. See vital signs in flow sheet. Tolerated Procedure well. 

## 2020-04-02 NOTE — Anesthesia Procedure Notes (Signed)
Anesthesia Regional Block: Pectoralis block   Pre-Anesthetic Checklist: ,, timeout performed, Correct Patient, Correct Site, Correct Laterality, Correct Procedure, Correct Position, site marked, Risks and benefits discussed,  Surgical consent,  Pre-op evaluation,  At surgeon's request and post-op pain management  Laterality: Left  Prep: chloraprep       Needles:  Injection technique: Single-shot  Needle Type: Echogenic Needle     Needle Length: 9cm  Needle Gauge: 21     Additional Needles:   Procedures:,,,, ultrasound used (permanent image in chart),,,,  Narrative:  Start time: 04/02/2020 12:10 PM End time: 04/02/2020 12:17 PM Injection made incrementally with aspirations every 5 mL.  Performed by: Personally  Anesthesiologist: Catalina Gravel, MD  Additional Notes: No pain on injection. No increased resistance to injection. Injection made in 5cc increments.  Good needle visualization.  Patient tolerated procedure well.

## 2020-04-02 NOTE — Transfer of Care (Signed)
Immediate Anesthesia Transfer of Care Note  Patient: Danny Gutierrez  Procedure(s) Performed: LEFT MASTECTOMY WITH LEFT TARGETED RADIOACTIVE SEED  LYMPH NODE BIOPSY AND LEFT SENTINEL LYMPH NODE MAPPING (Left Breast)  Patient Location: PACU  Anesthesia Type:GA combined with regional for post-op pain  Level of Consciousness: sedated and patient cooperative  Airway & Oxygen Therapy: Patient Spontanous Breathing and Patient connected to nasal cannula oxygen  Post-op Assessment: Report given to RN, Post -op Vital signs reviewed and stable and Patient moving all extremities  Post vital signs: Reviewed and stable  Last Vitals:  Vitals Value Taken Time  BP 129/74 04/02/20 1407  Temp    Pulse 84 04/02/20 1411  Resp 13 04/02/20 1411  SpO2 100 % 04/02/20 1411  Vitals shown include unvalidated device data.  Last Pain:  Vitals:   04/02/20 1150  TempSrc: Oral  PainSc: 0-No pain      Patients Stated Pain Goal: 4 (54/65/68 1275)  Complications: No complications documented.

## 2020-04-02 NOTE — Anesthesia Preprocedure Evaluation (Addendum)
Anesthesia Evaluation  Patient identified by MRN, date of birth, ID band Patient awake    Reviewed: Allergy & Precautions, NPO status , Patient's Chart, lab work & pertinent test results  Airway Mallampati: II  TM Distance: >3 FB Neck ROM: Full    Dental  (+) Edentulous Upper, Edentulous Lower   Pulmonary neg pulmonary ROS,    Pulmonary exam normal breath sounds clear to auscultation       Cardiovascular negative cardio ROS Normal cardiovascular exam Rhythm:Regular Rate:Normal     Neuro/Psych negative neurological ROS     GI/Hepatic negative GI ROS, Neg liver ROS,   Endo/Other  negative endocrine ROS  Renal/GU negative Renal ROS     Musculoskeletal negative musculoskeletal ROS (+)   Abdominal   Peds  Hematology negative hematology ROS (+)   Anesthesia Other Findings Day of surgery medications reviewed with the patient.  Breast cancer  Reproductive/Obstetrics                            Anesthesia Physical Anesthesia Plan  ASA: II  Anesthesia Plan: General   Post-op Pain Management:  Regional for Post-op pain   Induction: Intravenous  PONV Risk Score and Plan: 2 and Ondansetron, Dexamethasone and Midazolam  Airway Management Planned: LMA  Additional Equipment:   Intra-op Plan:   Post-operative Plan: Extubation in OR  Informed Consent: I have reviewed the patients History and Physical, chart, labs and discussed the procedure including the risks, benefits and alternatives for the proposed anesthesia with the patient or authorized representative who has indicated his/her understanding and acceptance.       Plan Discussed with: CRNA  Anesthesia Plan Comments:         Anesthesia Quick Evaluation

## 2020-04-02 NOTE — Anesthesia Postprocedure Evaluation (Signed)
Anesthesia Post Note  Patient: Danny Gutierrez  Procedure(s) Performed: LEFT MASTECTOMY WITH LEFT TARGETED RADIOACTIVE SEED  LYMPH NODE BIOPSY AND LEFT SENTINEL LYMPH NODE MAPPING (Left Breast)     Patient location during evaluation: PACU Anesthesia Type: General Level of consciousness: awake and alert Pain management: pain level controlled Vital Signs Assessment: post-procedure vital signs reviewed and stable Respiratory status: spontaneous breathing, nonlabored ventilation and respiratory function stable Cardiovascular status: blood pressure returned to baseline and stable Postop Assessment: no apparent nausea or vomiting Anesthetic complications: no   No complications documented.  Last Vitals:  Vitals:   04/02/20 1407 04/02/20 1415  BP: 129/74 (!) 128/94  Pulse: 77 84  Resp: 19 15  Temp: 36.6 C   SpO2: 96% 98%    Last Pain:  Vitals:   04/02/20 1407  TempSrc:   PainSc: 0-No pain                 Catalina Gravel

## 2020-04-02 NOTE — Progress Notes (Signed)
Call request to [employer], spoke to Erin:  Requested, [place], for tomorrow morning 04/03/20 at 0700 to help interpret for DC home instructions.

## 2020-04-02 NOTE — Op Note (Signed)
Preoperative diagnosis: stage 2 left  breast cancer  Postoperative diagnosis: same  Procedure: left simple mastectomy and left  sentinel lymph node mapping left targeted seed lymph node biopsy  Surgeon: Marcello Moores A. Ameet Sandy MD   Assistant surgeon: none  Anesthesia:General  Clinical History and Indications: The patient presents for left simple mastectomy and left sentinel lymph node mapping mapping and left targeted lymph node biopsy. The patient is seen in the holding area and we reviewed the plans for the procedure as noted above. We reviewed the risks and complications a final time. he had no further questions. I marked theright side as the operative side. The surgical and non surgical options have been discussed with the patient.  Risks of surgery include bleeding,  Infection,  Flap necrosis,  Tissue loss,  Chronic pain, death, Numbness,  And the need for additional procedures.  Reconstruction options also have been discussed with the patient as well.  The patient agrees to proceed.  Description of Procedure: The patient was taken to the operating room. Nuclear medicine injected Tc colloid in the left  breast in the OR.   After satisfactory general anesthesia was obtained the timeout was done.    A full prep and drape was done.   I outlined an elliptical incision and marked the inframammary fold and midline. The incision was made. The usual skin flaps were raised. I went to the clavicle superiorly and sternum medially and towards the latissimus laterally.    After the superior flap was complete I was able to use the neoprobe to locate a sentinel node.  One hot   sentinel lymph node located in level 1 nodal basin were removed.     Once the node was removed it was forwarded to pathology. Neoprobe used and targeted node identified and removed.    The inferior flap was then made going to the inframammary fold and out to the latissimus. The breast was then removed from the pectoralis starting  medially and working laterally. When I got to the lateral aspect of the pectoralis major muscle I opened the clavipectoral fascia. I finished removing the breast from the serratus muscles.  .  I therefore completed the mastectomy by dividing the remaining attachments from the breast to the chest wall and latissimus but not taking any more tissue from the axilla. The specimen was marked to orient it for the pathologist and passed off the table.  I spent several minutes irrigating and making sure everything was dry. I then placed one  64 [place] Blake drain through a small incision in the inferior flap and laid along the pectoralis muscle. It was secured with a 2-0 nylon suture.  Another irrigation and check for hemostasis was made. Everything appeared to be dry. Incision was then closed with 3 O vicryl and 4 O monocryl.  Dermabond applied. Drain placed to suction and binder applied.   The patient tolerated the procedure well. There were no operative complications. All counts were correct. Estimated blood loss was 80 cc Sterile dressings were applied and the patient taken to the PACU in satisfactory condition.  Turner Daniels, MD, FACS

## 2020-04-03 ENCOUNTER — Encounter (HOSPITAL_BASED_OUTPATIENT_CLINIC_OR_DEPARTMENT_OTHER): Payer: Self-pay | Admitting: Surgery

## 2020-04-03 NOTE — Discharge Instructions (Signed)
[facility] [phone]  MASTECTOMY: POST OP INSTRUCTIONS  Always review your discharge instruction sheet given to you by the facility where your surgery was performed. IF YOU HAVE DISABILITY OR FAMILY LEAVE FORMS, YOU MUST BRING THEM TO THE OFFICE FOR PROCESSING.   DO NOT GIVE THEM TO YOUR DOCTOR. A prescription for pain medication may be given to you upon discharge.  Take your pain medication as prescribed, if needed.  If narcotic pain medicine is not needed, then you may take acetaminophen (Tylenol) or ibuprofen (Advil) as needed. 1. Take your usually prescribed medications unless otherwise directed. 2. If you need a refill on your pain medication, please contact your pharmacy.  They will contact our office to request authorization.  Prescriptions will not be filled after 5pm or on week-ends. 3. You should follow a light diet the first few days after arrival home, such as soup and crackers, etc.  Resume your normal diet the day after surgery. 4. Most patients will experience some swelling and bruising on the chest and underarm.  Ice packs will help.  Swelling and bruising can take several days to resolve.  5. It is common to experience some constipation if taking pain medication after surgery.  Increasing fluid intake and taking a stool softener (such as Colace) will usually help or prevent this problem from occurring.  A mild laxative (Milk of Magnesia or Miralax) should be taken according to package instructions if there are no bowel movements after 48 hours. 6. Unless discharge instructions indicate otherwise, leave your bandage dry and in place until your next appointment in 3-5 days.  You may take a limited sponge bath.  No tube baths or showers until the drains are removed.  You may have steri-strips (small skin tapes) in place directly over the incision.  These strips should be left on the skin for 7-10 days.  If your surgeon used skin glue on the incision, you may  shower in 24 hours.  The glue will flake off over the next 2-3 weeks.  Any sutures or staples will be removed at the office during your follow-up visit. 7. DRAINS:  If you have drains in place, it is important to keep a list of the amount of drainage produced each day in your drains.  Before leaving the hospital, you should be instructed on drain care.  Call our office if you have any questions about your drains. 8. ACTIVITIES:  You may resume regular (light) daily activities beginning the next day--such as daily self-care, walking, climbing stairs--gradually increasing activities as tolerated.  You may have sexual intercourse when it is comfortable.  Refrain from any heavy lifting or straining until approved by your doctor. a. You may drive when you are no longer taking prescription pain medication, you can comfortably wear a seatbelt, and you can safely maneuver your car and apply brakes. b. RETURN TO WORK:  __________________________________________________________ 9. You should see your doctor in the office for a follow-up appointment approximately 3-5 days after your surgery.  Your doctor's nurse will typically make your follow-up appointment when she calls you with your pathology report.  Expect your pathology report 2-3 business days after your surgery.  You may call to check if you do not hear from [place] after three days.   10. OTHER INSTRUCTIONS: ______________________________________________________________________________________________ ____________________________________________________________________________________________ WHEN TO CALL YOUR DOCTOR: 1. Fever over 101.0 2. Nausea and/or vomiting 3. Extreme swelling or bruising 4. Continued bleeding from incision. 5. Increased pain, redness, or drainage from the incision.  The clinic staff is available to answer your questions during regular business hours.  Please don't hesitate to call and ask to speak to one of the nurses for clinical  concerns.  If you have a medical emergency, go to the nearest emergency room or call 911.  A surgeon from [facility] is always on call at the hospital. [address] [place], [place], [place]  [address] ? [address], [place], [place]   [address] [phone] ? [phone] ? [phone] Web site: [email or web address] ????????????? ?????? ?? ???? [facility] ????????????? ?????? ????????? ??????? ?????????? ????????, ??????? ? ????? ???????????? ? ???????????? ???? ????? ?????????????? ?????????????. ????????????? ?????? ???????? ?????????? ????. ????????? ????????? ????????? ?????????????? ???????:  ???????? ??????. ??? ????? ???? ??????? ?????????? ?????????? ?? ????????????? ????. ??????????? ???????? ?????????? ????? ?????? ? ?????????, ????????????? ??????? ????. ?? ????? ??????? ????? ??? ????? ??????? ????? ??? ????????? ????????? ???????? (???????) ?? ??????????? ??? ???????, ????? ???? ??? ??????????. ????????? ????? ??? ?????????? ??????? ??????????.  ????????? ??????. ??? ????? ??????? ???????? ????????? ?????????, ??? ????????? ???? ???????. ??????????? ???????? ?????????? ????? ?????? ? ?????? (???????) ??? ? ?????????, ????????????? ??????? ?????????. ??????? ?????????? ??????? ?????????? ?? ?????????. ??????? ???????????? ????????????? ?? ????? ????????. ????? ????? ???????? ??????????? ???????? ?????? ????? ????-??????? ???? ? ??????? ????? ??????, ??? ????. ?????????? ??????????? ????? ??????? ??? ???????? ? ??????????? ????? ??????. ?????? ????????, ??? ??? ??????? ???? ?????? ??????, ????? ????????? ???????????, ??? ????? ?? ????? ?????????? ?? 1-2 ???????? ????? (15-30 ??) ?? 24-??????? ??????. ??????????? ?????????:  ????????.  ???????????? ????????? ??????? (?????????? ??????? ???????).  ?????? ???????.  ???????? ???????: 4 x 4 ????? (10 x 10 ??).  ???????? ????????: 4 x 4 ????? (10 x 10 ??). ??? ????????? ?? ????? ?????????????  ???????? ?????????? ?? ????? ????????? ???????? ??? ??????? ????? ??????? ??????. ??? ????? ??? ???????????? ????????. ???? ??? ?????? ?????????? ?? ????? ??? ? ?????? ??????????????? ?????, ????????? ??????? ???????? ?????? ??? ? ?????????? ????????? ????????: ????? ????  ????????? ????????????? ???? ?????? ??????? ????? ? ???????? ????????.  ?????????? ????? ??????? ?????? ???? ?? ??????? ????????? ????????. ?????????? ??: ? ???????????, ??????????? ??? ????. ? ????????? ???? ??? ??????????? ??????. ? ?????????? ??????????? ????????. ? ????????????? ??? ???????? ? ????? ?????? ???????. ????? ??????? ?????????? ??????????? ?????? ???????? ????? ?? ????? ???????. ??????? ???? ??????? ?? ???? ?????? ???? ? ????. ??? ????????????? ??????? ?? ????, ????? ??? ?????????? ?????. ???????????: 1. ???????? ?????????. 2. ????? ???? ????? ? ????? ????? ?????? ???????. ???? ???? ? ???? ??????????, ??????????? ??????????????? ???????? ??? ???. 3. ???????? ?????? ???????. ????????? ????????????? ??? ????? ??????. 4. ????? ?????? ?????? ??????? ????? ??????? ???? ????? ? ?????. 5. ??????????? ?????????? ??????????????? ??????? ??? ??????? ???? ?????? ???????. ??? ??????? ???? ??? ????? ???????????? ?????? ??????. 6. ????????? ?????? ?????? ???????? ???????. ?????????? ???????? ??????? ????? ???????, ????? ??? ????????? ???? ????. 7. ????????? ???????? ???????? ??? ?????? ???????? ??????? ?? ??????? ????? ???????, ??????????? ? ????. ?????????, ??? ?????? ????????????? ????? ????? ??????. 8. ?????????? ??? ??????? ????????? ? ????. 9. ?????????? ????????? ?????? ????????? ? ???? ?? ?????????? 1-2 ?????? (2,5-5 ??) ???? ???? ?????, ???? ????????? ??????. ???????????? ?????? ????????? ????????? ????????????? ??????????? ?????????? ???? (??????? ?????????). 10. ????? ???? ????? ? ?????. 11. ??????????? ???? ??????????? ???????? ? ??? ????? ?? ??????? ???????. ??? ?????????? ??????? ??? ?????????  ???????  1. ?????????, ??? ? ??? ???? ?????? ?????????, ? ??????? ?? ?????? ?????????? ????????? ???????. 2. ????? ???? ????? ? ?????. ???? ???? ? ???? ??????????, ??????????? ??????????????? ???????? ??? ???.  3. ???????? ??? ????????? ??? ??????, ???????????? ?????? ?? ?????. 4. ???? ??? ??????? ???? ?????? ??? ?????????? ??????, ????? ????????????? ??????????? ??????? ? ????????? ??????: ? ??????????? ?????? ?? ???? ????? ?????. ??????? ? ???????????? ???????? ?????? ???? ??????? ??????. ? ????????? ?????? ?????? ???????? ??????????? ?? ????? ??????. ???? ????? ????????? ????????? ?? ?????? ????????? ????????, ?????? ??? ??????? ??????. ? ????????, ??? ??????????? ??????????? ?????????? ?? ?????? ????????? ??? ? ????, ????? ??? ?????????? ??????. ?? ?????? ?? ??? ??????. 5. ???????? ?????? ??????? ??? ?????? ????????? ???????. ?? ???????????? ? ?????????? ????? ?????? ??? ?? ??? ??????. 6. ??????????? ?????????? ? ????????? ???????. 7. ??????? ??? ????????? ???????? ? ?????? ??????. 8. ??????? ????? ??? ????????? ? ???????? ??? ??????? ??? ???????. ????? ?????????? ?????? ????? ??? ??????????, ?????? ??????????? ??? ??????????, ????? ?????????? ????????? ??????? ??? ???????. 9. ??????????? ????? ????????? ????????, ??????? ?????????? ? ??? ?? ?????? 24 ????. ???? ???? ????? ? ??? ?????????? ????? 2 ???????? ????? (30 ??) ? ??????? 24 ?????, ????????? ? ????? ??????? ??????. 10. ?????? ????????? ???????? ? ??????. 11. ????? ???? ????? ? ?????. ?????????? ? ???????? ?????, ????:  ???? ??????? ??????? ??????????, ???????? ??? ?????.  ? ??? ? ??????? ??????? ???????? ???? ??? ?????? ?????.  ? ??? ????????? ??? ?????.  ???? ?????? ??????? ????? ?????? ?? ?????.  ?????????? ????????? ???????? ?? ???????????, ? ??????????.  ????????? ???????? ????? ??????.  ? ??? ???????? ??????????? ????? ??? ????? ??????????? ?????????? ????????? ????????.  ???? ????????? ?????? ????????.  ?????  ??????? ??????? ????????? ??????? ?? ???????? ??????. ??????  ????????????? ?????? ????????? ??????? ?????????? ????????, ??????? ? ????? ???????????? ? ???????????? ???? ????? ?????????????? ?????????????.  ???? ????????????? ???????? ???????? ???????? ? ????????? ???????. ? ???????? ???????? ???????????? ??????? ???????????, ??????? ???????????? ?????? ??????????? ?? ???????????? ????, ? ????????? ????? ??????????? ?????????? ???????????? ???????.  ????? ????????? ?? ????? ????????, ????? ????????????? ???????? ????????. ???? ??? ?????? ?????????? ?? ????? ??? ? ?????? ??????????????? ?????, ????????? ????-?? ??????? ?????? ???.  ?????????? ? ?????? ???????? ?????, ???? ? ??? ? ??????? ??????? ?????????? ???????????, ??????????? ??? ????. ??? ?????????? ?? ????? ???????? ??????, ??????????????? ????? ??????. ??????????? ???????? ????? ???????????? ??? ??????? ? ????? ??????? ??????. Document Revised: 08/16/2018 Document Reviewed: 08/16/2018 Elsevier Patient Education  [place], Male Men have a small amount of breast tissue and can get breast cancer. Breast cancer is a malignant growth of tissue(tumor) in the breast. Unlike noncancerous (benign) tumors, malignant tumors are cancerous and can spread to other parts of the body. What are the causes? The cause of male breast cancer is unknown. What increases the risk? The following factors may make you more likely to develop this condition:  Age. Most cases of male breast cancer occur in men who are in their 73s or 18s. The average age of diagnosis is 57.  Family history of breast cancer.  Having the BRCA1 and BRCA2 genes.  Having a history of radiation exposure.  Using medicines that contain estrogen.  Scarring of the liver (cirrhosis).  Obesity.  Having a genetic disorder called Klinefelter syndrome.  Certain problems that affect the testicles. What are the signs or symptoms? Symptoms of this condition  include:  A painless lump in the breast.  Breast skin changes, such as puckering or dimpling.  Nipple abnormalities, such as scaling, crustiness, redness, or pulling in (retraction).  Nipple discharge that is bloody or clear. How is this diagnosed? This condition may be diagnosed by:  Medical history.  A physical exam.  This will involve feeling the tissue around your breast and under your arms.  Taking a sample of any nipple discharge. The sample will be examined under a microscope.  Imaging tests, such as breast X-rays (mammogram), breast ultrasound exams, or an MRI.  Taking a tissue sample (biopsy) from the breast. The sample will be examined under a microscope to look for cancer cells.  Taking a sample from the lymph nodes near the affected breast (sentinel node biopsy). Your cancer will be staged to determine how severe and advanced it is (severity and extent). Staging is a careful attempt to find out the size of the tumor, whether the cancer has spread (metastasized), and if so, to what parts of the body. You may need to have more tests to determine the stage of your cancer. How is this treated? Depending on the type and stage, male breast cancer may be treated with one or more of the following therapies:  Surgery. The goal is to remove as much of the cancer as possible. This is done with a procedure to remove the whole breast (mastectomy) and nipple. Some normal tissue surrounding this area may also be removed. This surgery may also involve removing lymph nodes in the area to be checked for cancer cells.  Radiation therapy, which uses high-energy rays to kill cancer cells.  Chemotherapy, which is the use of medicines to kill cancer cells.  Hormone therapy, which involves taking medicine to adjust the hormone levels in your body. You may take medicine to decrease your estrogen levels. This can help stop cancer cells from growing.  Targeted therapy, in which medicines are used  to block the growth and the spread of cancer cells. These medicines target a specific part of the cancer cell and usually cause fewer side effects than chemotherapy. They may be used alone or in combination with chemotherapy. Follow these instructions at home:   Take over-the-counter and prescription medicines only as told by your health care provider.  Eat a healthy diet. A healthy diet includes lots of fruits and vegetables, low-fat dairy products, lean meats, and fiber. ? Make sure half your plate is filled with fruits or vegetables. ? Choose high-fiber foods such as whole-grain breads and cereals.  Consider joining a support group. This may help you learn to cope with the stress of having breast cancer.  Talk to your health care team about exercise and physical activity. The right exercise program can: ? Help prevent or reduce symptoms such as fatigue or depression. ? Improve overall health and survival rates.  Keep all follow-up visits as told by your health care provider. This is important. Where to find more information  American Cancer Society: www.cancer.[place]: www.cancer.gov Contact a health care provider if:  You have a sudden increase in pain.  You have any symptoms or changes that concern you.  You notice a new lump in either breast or under your arm.  You develop swelling in either arm or hand.  You lose weight without trying.  You have a fever.  You notice new fatigue or weakness. Get help right away if:  You have chest pain or trouble breathing.  You faint. Summary  Breast cancer is a cancerous growth of tissue (malignant tumor) in the breast.  The cause of this condition is not known, but age, the presence of a cancer gene, family history of breast cancer, and history of exposure to radiation increase the risk of getting breast cancer.  Your cancer  will be staged to determine how severe and advanced it is (severity and  extent).  Treatment for this condition depends on the type and stage of the breast cancer. This information is not intended to replace advice given to you by your health care provider. Make sure you discuss any questions you have with your health care provider. Document Revised: 05/19/2017 Document Reviewed: 03/22/2017 Elsevier Patient Education  [place].  About my Jackson-Pratt Bulb Drain  What is a Jackson-Pratt bulb? A Jackson-Pratt is a soft, round device used to collect drainage. It is connected to a long, thin drainage catheter, which is held in place by one or two small stiches near your surgical incision site. When the bulb is squeezed, it forms a vacuum, forcing the drainage to empty into the bulb.  Emptying the Jackson-Pratt bulb- To empty the bulb: 1. Release the plug on the top of the bulb. 2. Pour the bulb's contents into a measuring container which your nurse will provide. 3. Record the time emptied and amount of drainage. Empty the drain(s) as often as your     doctor or nurse recommends.  Date                  Time                    Amount (Drain 1)                 Amount (Drain 2)  _____________________________________________________________________  _____________________________________________________________________  _____________________________________________________________________  _____________________________________________________________________  _____________________________________________________________________  _____________________________________________________________________  _____________________________________________________________________  _____________________________________________________________________  Squeezing the Jackson-Pratt Bulb- To squeeze the bulb: 1. Make sure the plug at the top of the bulb is open. 2. Squeeze the bulb tightly in your fist. You will hear air squeezing from the bulb. 3. Replace the plug  while the bulb is squeezed. 4. Use a safety pin to attach the bulb to your clothing. This will keep the catheter from     pulling at the bulb insertion site.  When to call your doctor- Call your doctor if:  Drain site becomes red, swollen or hot.  You have a fever greater than 101 degrees F.  There is oozing at the drain site.  Drain falls out (apply a guaze bandage over the drain hole and secure it with tape).  Drainage increases daily not related to activity patterns. (You will usually have more drainage when you are active than when you are resting.)  Drainage has a bad odor.

## 2020-04-03 NOTE — Discharge Summary (Signed)
Physician Discharge Summary  Patient ID: Danny Gutierrez MRN: [id number] DOB/AGE: [id number] y.o.  Admit date: 04/02/2020 Discharge date: 04/03/2020  Admission Diagnoses:left breast cancer  Discharge Diagnoses:  same  Discharged Condition: good  Hospital Course: Pt did well after left mastectomy. He had good pain control, tolerated diet and had instructions given in [place] t him.        Treatments: surgery: left simple mastectomy left SLN mapping and targeted biopsy   Discharge Exam: Blood pressure 130/64, pulse 66, temperature 97.6 F (36.4 C), resp. rate 16, height 5\' 6"  (1.676 m), weight 73 kg, SpO2 97 %. General appearance: alert and cooperative Breasts: normal appearance, no masses or tenderness, left mastectomy wound CDIserous drainage noted no hematoma   Disposition: Discharge disposition: 01-Home or Self Care       Discharge Instructions    Diet - low sodium heart healthy   Complete by: As directed    Increase activity slowly   Complete by: As directed         Signed: Joyice Faster Elleanna Melling 04/03/2020, 11:20 AM

## 2020-04-07 ENCOUNTER — Encounter: Payer: Self-pay | Admitting: *Deleted

## 2020-04-07 ENCOUNTER — Telehealth: Payer: Self-pay | Admitting: *Deleted

## 2020-04-07 LAB — SURGICAL PATHOLOGY

## 2020-04-07 NOTE — Telephone Encounter (Signed)
Received order for mammaprint testing. Requisition faxed to pathology and [facility] 

## 2020-04-08 DIAGNOSIS — C50222 Malignant neoplasm of upper-inner quadrant of left male breast: Secondary | ICD-10-CM | POA: Diagnosis not present

## 2020-04-08 NOTE — Progress Notes (Signed)
 Patient Care Team: Plotnikov, Aleksei V, MD as PCP - General (Internal Medicine) Martini, Keisha N, RN as Oncology Nurse Navigator Stuart, Dawn C, RN as Oncology Nurse Navigator Cornett, Thomas, MD as Consulting Physician (General Surgery) Gudena, Vinay, MD as Consulting Physician (Hematology and Oncology) Moody, John, MD as Consulting Physician (Radiation Oncology)  DIAGNOSIS:    ICD-10-CM   1. Malignant neoplasm of upper-inner quadrant of left breast in male, estrogen receptor positive (HCC)  C50.222    Z17.0     SUMMARY OF ONCOLOGIC HISTORY: Oncology History  Malignant neoplasm of upper-inner quadrant of left breast in male, estrogen receptor positive (HCC)  03/12/2019 Initial Diagnosis   Patient palpated a left breast mass. Diagnostic mammogram and US showed no right breast malignancy and a 3.0cm mass in the upper inner quadrant of the left breast with a promnent left axillary lymph node. Biopsy showed invasive mammary carcinoma in the breast and axilla, grade 2, HER-2 equivocal by IHC (2+), ER/PR+ >95%, Ki67 25%.   04/02/2020 Surgery   Left mastectomy (Cornett): invasive and in situ ductal carcinoma, 2.8cm, clear margins, and 1/2 left axillary lymph nodes positive for carcinoma, 1.1cm.      CHIEF COMPLIANT: Follow-up s/p left mastectomy    INTERVAL HISTORY: Danny Gutierrez is a 74 y.o. with above-mentioned history of left breast cancer. She underwent a left mastectomy on 04/02/20 with Dr. Cornett for which pathology showed invasive and in situ ductal carcinoma, 2.8cm, clear margins, and 1/2 left axillary lymph nodes positive for carcinoma, 1.1cm. She presents to the clinic today to review the pathology report and discuss further treatment.  Is doing very well from surgery standpoint.  He reports no pain or discomfort.  He has not been taking any pain medication.  He has surgical drain in place and wants to know when it can be removed.  ALLERGIES:  is allergic to sulfa  antibiotics.  MEDICATIONS:  Current Outpatient Medications  Medication Sig Dispense Refill  . acetaminophen (TYLENOL) 500 MG tablet Take 500 mg by mouth every 6 (six) hours as needed.    . Ascorbic Acid (SM VITAMIN C) 500 MG CHEW Chew by mouth.    . aspirin EC 81 MG tablet Take 81 mg by mouth daily. Swallow whole.    . Cholecalciferol 1000 UNITS tablet Take 1,000 Units by mouth daily.    . dorzolamide-timolol (COSOPT) 22.3-6.8 MG/ML ophthalmic solution INT 1 GTT IN OU BID  6  . HYDROcodone-acetaminophen (NORCO/VICODIN) 5-325 MG tablet Take 1 tablet by mouth every 6 (six) hours as needed for moderate pain. 15 tablet 0  . ibuprofen (ADVIL) 800 MG tablet Take 1 tablet (800 mg total) by mouth every 8 (eight) hours as needed. 30 tablet 0  . Melatonin 10 MG TABS Take by mouth.    . Multiple Minerals-Vitamins (CALCIUM-MAGNESIUM-ZINC-D3 PO) Take by mouth.    . Multiple Vitamins-Minerals (CENTRUM SILVER ADULT 50+ PO) Take by mouth.    . Multiple Vitamins-Minerals (OCUVITE EYE HEALTH FORMULA PO) Take by mouth daily.    . Multiple Vitamins-Minerals (ONE-A-DAY PROACTIVE 65+ PO) Take by mouth.    . OVER THE COUNTER MEDICATION Focus Factor    . Pollen Extracts (PROSTAT PO) Take by mouth. prostagenix    . SUPER B COMPLEX/C PO Take by mouth.     No current facility-administered medications for this visit.    PHYSICAL EXAMINATION: ECOG PERFORMANCE STATUS: 1 - Symptomatic but completely ambulatory  Vitals:   04/09/20 1032  BP: (!) 148/90  Pulse: 75    Resp: 18  Temp: 98 F (36.7 C)  SpO2: 98%   Filed Weights   04/09/20 1032  Weight: 158 lb 3.2 oz (71.8 kg)    LABORATORY DATA:  I have reviewed the data as listed CMP Latest Ref Rng & Units 03/18/2020 02/06/2020 02/05/2019  Glucose 70 - 99 mg/dL 122(H) 114(H) 110(H)  BUN 8 - 23 mg/dL 12 14 14  Creatinine 0.61 - 1.24 mg/dL 0.78 0.73 0.72  Sodium 135 - 145 mmol/L 139 140 139  Potassium 3.5 - 5.1 mmol/L 4.0 4.7 4.1  Chloride 98 - 111 mmol/L 105  106 105  CO2 22 - 32 mmol/L 29 26 26  Calcium 8.9 - 10.3 mg/dL 9.6 9.4 9.6  Total Protein 6.5 - 8.1 g/dL 7.4 6.8 7.1  Total Bilirubin 0.3 - 1.2 mg/dL 0.6 0.6 0.7  Alkaline Phos 38 - 126 U/L 69 - 57  AST 15 - 41 U/L 16 17 15  ALT 0 - 44 U/L 21 19 18    Lab Results  Component Value Date   WBC 9.0 03/18/2020   HGB 15.1 03/18/2020   HCT 45.0 03/18/2020   MCV 83.8 03/18/2020   PLT 199 03/18/2020   NEUTROABS 6.1 03/18/2020    ASSESSMENT & PLAN:  Malignant neoplasm of upper-inner quadrant of left breast in male, estrogen receptor positive (HCC) 04/02/2020:Left mastectomy (Cornett): invasive and in situ ductal carcinoma, 2.8cm, clear margins, and 1/2 left axillary lymph nodes positive for carcinoma with extranodal extension, 1.1cm. grade 2, HER-2 equivocal by IHC (2+) FISH negative, ER/PR+ >95%, Ki67 25%.  Pathology counseling: I discussed the final pathology report of the patient provided  a copy of this report. I discussed the margins as well as lymph node surgeries. We also discussed the final staging along with previously performed ER/PR and HER-2/neu testing.  Treatment plan: 1. MammaPrint testing to determine if chemotherapy would be of any benefit followed by 2. adjuvant radiation 3. Adjuvant antiestrogen therapy with tamoxifen x10 years  Return to clinic based upon MammaPrint test result.     No orders of the defined types were placed in this encounter.  The patient has a good understanding of the overall plan. he agrees with it. he will call with any problems that may develop before the next visit here.  Total time spent: 30 mins including face to face time and time spent for planning, charting and coordination of care  Gudena, Vinay, MD 04/09/2020  I, Molly Dorshimer, am acting as scribe for Dr. Vinay Gudena.  I have reviewed the above documentation for accuracy and completeness, and I agree with the above.       

## 2020-04-08 NOTE — Assessment & Plan Note (Signed)
04/02/2020:Left mastectomy (Cornett): invasive and in situ ductal carcinoma, 2.8cm, clear margins, and 1/2 left axillary lymph nodes positive for carcinoma, 1.1cm. grade 2, HER-2 equivocal by IHC (2+) FISH negative, ER/PR+ >95%, Ki67 25%.  Pathology counseling: I discussed the final pathology report of the patient provided  a copy of this report. I discussed the margins as well as lymph node surgeries. We also discussed the final staging along with previously performed ER/PR and HER-2/neu testing.  Treatment plan: 1. MammaPrint testing to determine if chemotherapy would be of any benefit followed by 2. Adjuvant antiestrogen therapy with tamoxifen x10 years  Return to clinic based upon MammaPrint test result.

## 2020-04-09 ENCOUNTER — Other Ambulatory Visit: Payer: Self-pay

## 2020-04-09 ENCOUNTER — Inpatient Hospital Stay: Payer: PPO | Attending: Hematology and Oncology | Admitting: Hematology and Oncology

## 2020-04-09 DIAGNOSIS — Z9221 Personal history of antineoplastic chemotherapy: Secondary | ICD-10-CM | POA: Diagnosis not present

## 2020-04-09 DIAGNOSIS — Z923 Personal history of irradiation: Secondary | ICD-10-CM | POA: Insufficient documentation

## 2020-04-09 DIAGNOSIS — C50222 Malignant neoplasm of upper-inner quadrant of left male breast: Secondary | ICD-10-CM | POA: Insufficient documentation

## 2020-04-09 DIAGNOSIS — Z170 Estrogen receptor positive status [ER+]: Secondary | ICD-10-CM | POA: Diagnosis not present

## 2020-04-09 DIAGNOSIS — Z79899 Other long term (current) drug therapy: Secondary | ICD-10-CM | POA: Diagnosis not present

## 2020-04-09 DIAGNOSIS — Z7982 Long term (current) use of aspirin: Secondary | ICD-10-CM | POA: Diagnosis not present

## 2020-04-09 DIAGNOSIS — Z9012 Acquired absence of left breast and nipple: Secondary | ICD-10-CM | POA: Insufficient documentation

## 2020-04-10 ENCOUNTER — Telehealth: Payer: Self-pay | Admitting: Hematology and Oncology

## 2020-04-10 NOTE — Telephone Encounter (Signed)
No 10/21 los, no changes made to pt schedule

## 2020-04-15 ENCOUNTER — Telehealth: Payer: Self-pay | Admitting: *Deleted

## 2020-04-15 ENCOUNTER — Encounter (HOSPITAL_COMMUNITY): Payer: Self-pay | Admitting: Hematology and Oncology

## 2020-04-15 DIAGNOSIS — Z170 Estrogen receptor positive status [ER+]: Secondary | ICD-10-CM

## 2020-04-15 DIAGNOSIS — C50222 Malignant neoplasm of upper-inner quadrant of left male breast: Secondary | ICD-10-CM

## 2020-04-15 NOTE — Telephone Encounter (Signed)
Received Mammaprint result of LOW RISK. Physician team notified.  Called pt with results and discussed does not need chemotherapy based on these results. Informed next step is xrt. Received verbal understanding. Referral placed for pt to see Dr. Lisbeth Renshaw.

## 2020-04-16 ENCOUNTER — Other Ambulatory Visit: Payer: Self-pay | Admitting: Oncology

## 2020-04-19 ENCOUNTER — Encounter: Payer: Self-pay | Admitting: Hematology and Oncology

## 2020-04-20 ENCOUNTER — Encounter: Payer: Self-pay | Admitting: *Deleted

## 2020-04-20 ENCOUNTER — Encounter: Payer: Self-pay | Admitting: Hematology and Oncology

## 2020-04-21 ENCOUNTER — Encounter: Payer: Self-pay | Admitting: *Deleted

## 2020-04-22 ENCOUNTER — Encounter: Payer: Self-pay | Admitting: Physical Therapy

## 2020-04-22 ENCOUNTER — Other Ambulatory Visit: Payer: Self-pay

## 2020-04-22 ENCOUNTER — Ambulatory Visit: Payer: PPO | Attending: Surgery | Admitting: Physical Therapy

## 2020-04-22 DIAGNOSIS — Z483 Aftercare following surgery for neoplasm: Secondary | ICD-10-CM | POA: Diagnosis not present

## 2020-04-22 DIAGNOSIS — Z170 Estrogen receptor positive status [ER+]: Secondary | ICD-10-CM

## 2020-04-22 DIAGNOSIS — M25612 Stiffness of left shoulder, not elsewhere classified: Secondary | ICD-10-CM

## 2020-04-22 DIAGNOSIS — R293 Abnormal posture: Secondary | ICD-10-CM | POA: Diagnosis not present

## 2020-04-22 DIAGNOSIS — C50212 Malignant neoplasm of upper-inner quadrant of left female breast: Secondary | ICD-10-CM

## 2020-04-22 DIAGNOSIS — C50222 Malignant neoplasm of upper-inner quadrant of left male breast: Secondary | ICD-10-CM | POA: Insufficient documentation

## 2020-04-22 NOTE — Therapy (Signed)
[place], [place], [address] Phone: [phone]   Fax:  [phone]  Physical Therapy Re-Evaluation  Patient Details  Name: Danny Gutierrez MRN: [id number] Date of Birth: 09/15/1945 Referring Provider (PT): Dr. Erroll Luna   Encounter Date: 04/22/2020   PT End of Session - 04/22/20 1326    Visit Number 2    Number of Visits 2    Date for PT Re-Evaluation 05/13/20    PT Start Time 1205    PT Stop Time 1300    PT Time Calculation (min) 55 min    Activity Tolerance Patient tolerated treatment well    Behavior During Therapy [facility] for tasks assessed/performed           Past Medical History:  Diagnosis Date  . Breast cancer ([place])   . Family history of breast cancer     Past Surgical History:  Procedure Laterality Date  . ADENOIDECTOMY  1957  . ADENOIDECTOMY     childhood  . MASTECTOMY W/ SENTINEL NODE BIOPSY Left 04/02/2020   Procedure: LEFT MASTECTOMY WITH LEFT TARGETED RADIOACTIVE SEED  LYMPH NODE BIOPSY AND LEFT SENTINEL LYMPH NODE MAPPING;  Surgeon: Erroll Luna, MD;  Location: [facility];  Service: General;  Laterality: Left;    There were no vitals filed for this visit.    Subjective Assessment - 04/22/20 1322    Subjective Pt says he is doing "normal"  He is driving.  He does not have any pain, He says he has had some drainage but the doctor is aware of it.  He thinks he is doing fine. He does not need to chemotherapy  He is not doing  any streching exercises with his shoulder because he is afraid to do something he should not do    Patient is accompained by: --   inperson interpreter not avaiable and pt denies audio or video interpreter   Pertinent History Patient was diagnosed on 03/04/2020 with left grade II invasive ductal carcinoma with DCIS breast cancer. It measures 2.3 cm and is located in the upper inner quadrant. It is ER/PR positive and HER2 negative  with a Ki67 of 25%. He has a positive axillary lymph node.on 07/04/2019 he had left mastectomy with 1/2 left axillary lymph nodes positive for carcinoma.  He will not have to have chemotherapy but will have radiation.  Past histroy includes glaucoma    Patient Stated Goals to learn what to do for his shoulder              [facility] PT Assessment - 04/22/20 0001      Assessment   Medical Diagnosis Left breast cancer    Referring Provider (PT) Dr. Marcello Moores Cornett    Onset Date/Surgical Date 03/04/20      Precautions   Precautions Other (comment)    Precaution Comments at risk for lymphedema       [place] residence    [place]   limited assist from family    Available Help at Discharge Family      Prior Function   Level of [place]   Overall Cognitive Status Within Functional Limits for tasks assessed      Observation/Other Assessments   Observations Old dirty dressing removed from drain site.  Incsion healing well with glue intact       Coordination   Gross Motor Movements are Fluid and Coordinated Yes  Posture/Postural Control   Posture/Postural Control Postural limitations    Postural Limitations Rounded Shoulders;Forward head      ROM / Strength   AROM / PROM / Strength AROM;Strength      AROM   Left Shoulder Extension 50 Degrees    Left Shoulder Flexion 120 Degrees    Left Shoulder ABduction 145 Degrees    Left Shoulder External Rotation 60 Degrees      Strength   Overall Strength Comments diffuse muscle atrophy      Palpation   Palpation comment pt with wave like flucutation above incision ( palpation at lateral chest produced tissue wave movment like fluid movment at medical chest)              LYMPHEDEMA/ONCOLOGY QUESTIONNAIRE - 04/22/20 0001      Left Upper Extremity Lymphedema   10 cm Proximal to Olecranon Process 26.3 cm    Olecranon Process 23.8 cm    10 cm  Proximal to Ulnar Styloid Process 19.5 cm    Just Proximal to Ulnar Styloid Process 15.3 cm    Across Hand at [employer] 18 cm    At [place] of 2nd Digit 6.2 cm                   Objective measurements completed on examination: See above findings.       [place] Adult PT Treatment/Exercise - 04/22/20 0001      Self-Care   Self-Care Other Self-Care Comments    Other Self-Care Comments  pt given informtation to go to [facility] to get a breast compression binder.  Pt is no sure if/when he will get it. He thinks he might get it and take it to Dr. Josetta Huddle appointment on Monday to see if /how he should wear it.       Exercises   Exercises Shoulder      Shoulder Exercises: Supine   Flexion AAROM;Both;10 reps   with dowel   ABduction AAROM;Left;10 reps   with dowel    Other Supine Exercises butterfly stretch with pillow under left elbow for comfort to prepare for radiation                  PT Education - 04/22/20 1325    Education Details supine shoulder dowel exercises to prepare for radiation    Person(s) Educated Patient    Methods Explanation;Demonstration;Handout    Comprehension Verbalized understanding;Returned demonstration               PT Long Term Goals - 04/22/20 1350      PT LONG TERM GOAL #1   Title Patient will demonstrate he has regained full shoulder ROM and function post operatively compared to baselines.    Baseline Pt lacks a few degress from baselin but had adeqeuate ROM for radiation    Period Weeks    Status Partially Met                  Plan - 04/22/20 1332    Clinical Impression Statement Pt comes to PT after his mastectomy surgery and he says he is doing "normal"  He had a old dressing over drain site that had not been removed since Oct 24 per his report.  It was removed and there is no drainage from site.  He does have large area of wave like fluctuation above the incision site when pressure  is applied at either end  of area indicating presence of fluid. I  recommended compression binder. He has not been wearing a compression binder and no longer has the one he had immediately post op.  We called [facility] and gave him directions to go purchase one.  He thinks he may purchase one and take it to Dr. Josetta Huddle appt on November 8 or just wait until that appointment . Otherwise, incision is healing well.   He was instructed in dowel rod shoulder ROM exercises and  he has sufficient ROM for radiation treatment . He was not scheduled for follow up PT at this time, but would beneft from PT in the future for strengthening and further ROM.  He knows to call if he feels he needs more PT He was scheduled for a [facility] follow up on Feb. 14 2022.    Stability/Clinical Decision Making Stable/Uncomplicated    PT Treatment/Interventions ADLs/Self Care Home Management;Therapeutic exercise;Patient/family education    PT Next Visit Plan Reassess, recert  and progress with exercise and manual work as  needed    PT Home Exercise Plan Post op shoulder ROM HEP    Consulted and Agree with Plan of Care Patient           Patient will benefit from skilled therapeutic intervention in order to improve the following deficits and impairments:  Postural dysfunction, Decreased range of motion, Decreased knowledge of precautions, Impaired UE functional use, Pain, Decreased strength, Decreased knowledge of use of DME, Increased edema  Visit Diagnosis: Aftercare following surgery for neoplasm - Plan: PT plan of care cert/re-cert  Abnormal posture - Plan: PT plan of care cert/re-cert  Malignant neoplasm of upper-inner quadrant of left breast in male, estrogen receptor positive ([place]) - Plan: PT plan of care cert/re-cert  Stiffness of left shoulder joint - Plan: PT plan of care cert/re-cert     Problem List Patient Active Problem List   Diagnosis Date Noted  . Family history of breast cancer   . Malignant neoplasm of upper-inner quadrant  of left breast in male, estrogen receptor positive ([place]) 03/16/2020  . Gynecomastia 02/06/2020  . Elevated BP without diagnosis of hypertension 02/05/2019  . Lip lesion 01/25/2018  . Glaucoma 01/24/2017  . Cough 01/13/2016  . Palpitations 01/12/2015  . Prostate nodule 01/12/2015  . Benign cystic mucinous tumor 01/08/2013  . Sebaceous cyst 01/24/2012  . Well adult exam 03/28/2011   Donato Heinz. Owens Shark PT  Norwood Levo 04/22/2020, 2:08 PM  [place], [place], [address] Phone: [phone]   Fax:  [phone]  Name: Danny Gutierrez MRN: [id number] Date of Birth: 1946-03-09

## 2020-04-22 NOTE — Patient Instructions (Signed)
SHOULDER: Flexion - Supine (Cane)        Cancer Rehab [phone] ° ° ° °Hold cane in both hands. Raise arms up overhead. Do not allow back to arch. Hold _5__ seconds. Do __5-10__ times; __1-2__ times a day. ° ° °SELF ASSISTED WITH OBJECT: Shoulder Abduction / Adduction - Supine ° ° ° °Hold cane with both hands. Move both arms from side to side, keep elbows straight.  Hold when stretch felt for __5__ seconds. Repeat __5-10__ times; __1-2__ times a day. Once this becomes easier progress to third picture bringing affected arm towards ear by staying out to side. Same hold for _5_seconds. Repeat  _5-10_ times, _1-2_ times/day. ° °Shoulder Blade Stretch ° ° ° °Clasp fingers behind head with elbows touching in front of face. Pull elbows back while pressing shoulder blades together. Relax and hold as tolerated, can place pillow under elbow here for comfort as needed and to allow for prolonged stretch.  °Repeat __5__ times. Do __1-2__ sessions per day. ° ° ° ° ° ° °

## 2020-04-29 ENCOUNTER — Encounter: Payer: Self-pay | Admitting: Hematology and Oncology

## 2020-04-29 ENCOUNTER — Other Ambulatory Visit: Payer: PPO

## 2020-05-07 ENCOUNTER — Ambulatory Visit
Admission: RE | Admit: 2020-05-07 | Discharge: 2020-05-07 | Disposition: A | Discharge status: Home / Self Care | Payer: PPO | Source: Ambulatory Visit | Attending: Radiation Oncology | Admitting: Radiation Oncology

## 2020-05-07 ENCOUNTER — Encounter: Payer: Self-pay | Admitting: Radiation Oncology

## 2020-05-07 ENCOUNTER — Other Ambulatory Visit: Payer: Self-pay

## 2020-05-07 VITALS — BP 136/94 | HR 72 | Temp 97.2°F | Resp 18 | Ht 66.0 in | Wt 161.1 lb

## 2020-05-07 DIAGNOSIS — Z7982 Long term (current) use of aspirin: Secondary | ICD-10-CM | POA: Diagnosis not present

## 2020-05-07 DIAGNOSIS — C50222 Malignant neoplasm of upper-inner quadrant of left male breast: Secondary | ICD-10-CM

## 2020-05-07 DIAGNOSIS — Z9012 Acquired absence of left breast and nipple: Secondary | ICD-10-CM | POA: Diagnosis not present

## 2020-05-07 DIAGNOSIS — Z170 Estrogen receptor positive status [ER+]: Secondary | ICD-10-CM

## 2020-05-07 DIAGNOSIS — Z510 Encounter for antineoplastic radiation therapy: Secondary | ICD-10-CM | POA: Insufficient documentation

## 2020-05-07 DIAGNOSIS — Z79899 Other long term (current) drug therapy: Secondary | ICD-10-CM | POA: Diagnosis not present

## 2020-05-07 DIAGNOSIS — C773 Secondary and unspecified malignant neoplasm of axilla and upper limb lymph nodes: Secondary | ICD-10-CM | POA: Diagnosis not present

## 2020-05-07 DIAGNOSIS — Z803 Family history of malignant neoplasm of breast: Secondary | ICD-10-CM | POA: Insufficient documentation

## 2020-05-07 DIAGNOSIS — Z1501 Genetic susceptibility to malignant neoplasm of breast: Secondary | ICD-10-CM | POA: Diagnosis not present

## 2020-05-07 NOTE — Progress Notes (Signed)
Radiation Oncology         [phone]) [phone] ________________________________  Name: Danny Gutierrez        MRN: [id number]  Date of Service: 05/07/2020 DOB: Oct 16, 1945  WJ:[id number], Evie Lacks, MD  Nicholas Lose, MD     REFERRING PHYSICIAN: Nicholas Lose, MD   DIAGNOSIS: The encounter diagnosis was Malignant neoplasm of upper-inner quadrant of left breast in male, estrogen receptor positive ([place]).   HISTORY OF PRESENT ILLNESS: Danny Gutierrez is a 74 y.o. male seen in the multidisciplinary breast clinic for a new diagnosis of left male breast cancer. The patient was noted to have palpable mass in the left breast. He was found to have a mass at 10:00 measuring 2.3 x 2.3 x 1.6 cm and at least one abnormal node was seen. He underwent a biopsy on 03/04/20 that revealed a grade 2 invasive ductal carcinoma with associated DCIS and LVSI. The tumor was ER/PR positive, HER2 was negative and his Ki 67 was 25%. His lymph node was also sampled and was positive for metastatic disease. Since our last visit the patient has had continued work-up. He did undergo staging scans that showed concerns for increased activity in the SI joint, and an MRI was ordered of the pelvis and sacrum to evaluate this but the patient declined.  He subsequently underwent mastectomy on 04/02/2020 which revealed a grade 2 invasive ductal carcinoma measuring 2.8 cm with associated DCIS.  His margins were negative and there was lymphovascular space involvement.  One of his 2 sampled lymph nodes were positive for cancer, again his tumor was ER/PR positive, HER-2 was negative by FISH and Ki-67 was 25%. Mammaprint was low risk. His case was discussed again in conference following his surgery and no additional surgery is required, no systemic chemotherapy has been recommended but it was discussed that he would benefit from postmastectomy radiation and antiestrogen in the adjuvant setting.  He is seen today to discuss options of moving  forward with radiotherapy.   PREVIOUS RADIATION THERAPY: No   PAST MEDICAL HISTORY:  Past Medical History:  Diagnosis Date  . Breast cancer ([place])   . Family history of breast cancer        PAST SURGICAL HISTORY: Past Surgical History:  Procedure Laterality Date  . ADENOIDECTOMY  1957  . ADENOIDECTOMY     childhood  . MASTECTOMY W/ SENTINEL NODE BIOPSY Left 04/02/2020   Procedure: LEFT MASTECTOMY WITH LEFT TARGETED RADIOACTIVE SEED  LYMPH NODE BIOPSY AND LEFT SENTINEL LYMPH NODE MAPPING;  Surgeon: Erroll Luna, MD;  Location: [place];  Service: General;  Laterality: Left;     FAMILY HISTORY:  Family History  Problem Relation Age of Onset  . Breast cancer Mother 77  . Heart disease Father 1       ?CAD     SOCIAL HISTORY:  reports that he has never smoked. He has never used smokeless tobacco. He reports that he does not drink alcohol and does not use drugs. The patient is from [place], he has been in the [place] for about a year. His son lives in [place]. He's accompanied by Servando Salina a [place] Interpretor known to our clinic.  ALLERGIES: Sulfa antibiotics   MEDICATIONS:  Current Outpatient Medications  Medication Sig Dispense Refill  . acetaminophen (TYLENOL) 500 MG tablet Take 500 mg by mouth every 6 (six) hours as needed.    . Ascorbic Acid (SM VITAMIN C) 500 MG CHEW Chew by mouth.    Marland Kitchen aspirin EC 81 MG  tablet Take 81 mg by mouth daily. Swallow whole.    . Cholecalciferol 1000 UNITS tablet Take 1,000 Units by mouth daily.    . dorzolamide-timolol (COSOPT) 22.3-6.8 MG/ML ophthalmic solution INT 1 GTT IN OU BID  6  . Melatonin 10 MG TABS Take by mouth.    . Multiple Minerals-Vitamins (CALCIUM-MAGNESIUM-ZINC-D3 PO) Take by mouth.    . Multiple Vitamins-Minerals (CENTRUM SILVER ADULT 50+ PO) Take by mouth.    . Multiple Vitamins-Minerals (OCUVITE EYE HEALTH FORMULA PO) Take by mouth daily.    . Multiple Vitamins-Minerals (ONE-A-DAY PROACTIVE 65+ PO) Take by  mouth.    Marland Kitchen OVER THE COUNTER MEDICATION Focus Factor    . Pollen Extracts (PROSTAT PO) Take by mouth. prostagenix    . SUPER B COMPLEX/C PO Take by mouth.     No current facility-administered medications for this encounter.     REVIEW OF SYSTEMS: On review of systems, the patient reports that he is doing well and healing nicely. He does have some fullness in his chest wall and is seeing PT for management of this. No other complaints are verbalized.     PHYSICAL EXAM:  Wt Readings from Last 3 Encounters:  04/09/20 158 lb 3.2 oz (71.8 kg)  04/02/20 160 lb 15 oz (73 kg)  03/18/20 159 lb 14.4 oz (72.5 kg)   Temp Readings from Last 3 Encounters:  04/09/20 98 F (36.7 C) (Tympanic)  04/03/20 97.6 F (36.4 C)  03/18/20 98.3 F (36.8 C) (Tympanic)   BP Readings from Last 3 Encounters:  04/09/20 (!) 148/90  04/03/20 130/64  03/18/20 (!) 162/99   Pulse Readings from Last 3 Encounters:  04/09/20 75  04/03/20 66  03/18/20 76   In general this is a well appearing [place] european male in no acute distress. He's alert and oriented x4 and appropriate throughout the examination. Cardiopulmonary assessment is negative for acute distress and he exhibits normal effort. Breast exam reveals well healed left mastectomy site. There is fullness of the tissue without fluctuance that could be drained.    ECOG = 1  0 - Asymptomatic (Fully active, able to carry on all predisease activities without restriction)  1 - Symptomatic but completely ambulatory (Restricted in physically strenuous activity but ambulatory and able to carry out work of a light or sedentary nature. For example, light housework, office work)  2 - Symptomatic, <50% in bed during the day (Ambulatory and capable of all self care but unable to carry out any work activities. Up and about more than 50% of waking hours)  3 - Symptomatic, >50% in bed, but not bedbound (Capable of only limited self-care, confined to bed or chair 50%  or more of waking hours)  4 - Bedbound (Completely disabled. Cannot carry on any self-care. Totally confined to bed or chair)  5 - Death   Eustace Pen MM, Creech RH, Tormey DC, et al. [phone]). "Toxicity and response criteria of the [facility]". [place] Oncol. 5 (6): 649-55    LABORATORY DATA:  Lab Results  Component Value Date   WBC 9.0 03/18/2020   HGB 15.1 03/18/2020   HCT 45.0 03/18/2020   MCV 83.8 03/18/2020   PLT 199 03/18/2020   Lab Results  Component Value Date   NA 139 03/18/2020   K 4.0 03/18/2020   CL 105 03/18/2020   CO2 29 03/18/2020   Lab Results  Component Value Date   ALT 21 03/18/2020   AST 16 03/18/2020   ALKPHOS  69 03/18/2020   BILITOT 0.6 03/18/2020      RADIOGRAPHY: No results found.     IMPRESSION/PLAN: 1. Stage IIA, pT2N1aM0 grade 2, ER/PR positive invasive ductal carinoma of the left male breast. Dr. Lisbeth Renshaw reviewed the final pathology findings and the nature of male breast disease. His mammaprint testing was low risk. He's seen today to revisit the rationale for postmastectomy radiotherapy as well as antiestrogen therapy. We discussed the risks, benefits, short, and long term effects of radiotherapy, and the patient is interested in proceeding. Dr. Lisbeth Renshaw discusses the delivery and logistics of radiotherapy and recommends 6 1/2 weeks of radiotherapy to the chest wall and regional nodes with deep inspiration breath hold technique. Written consent is obtained and placed in the chart, a copy was provided to the patient. He will simulate today. 2 Possible genetic predisposition to malignancy. The patient is a candidate for genetic testing given his personal and family history. He met with genetics but declined work up at this time.   In a visit lasting  60 minutes, greater than 50% of the time was spent face to face reviewing her case, as well as in preparation of, discussing, and coordinating the patient's care.  The above  documentation reflects my direct findings during this shared patient visit. Please see the separate note by Dr. Lisbeth Renshaw on this date for the remainder of the patient's plan of care.    Carola Rhine, PAC

## 2020-05-09 ENCOUNTER — Ambulatory Visit
Admission: RE | Admit: 2020-05-09 | Discharge: 2020-05-09 | Disposition: A | Discharge status: Home / Self Care | Payer: PPO | Source: Ambulatory Visit | Attending: Hematology and Oncology | Admitting: Hematology and Oncology

## 2020-05-09 ENCOUNTER — Other Ambulatory Visit: Payer: Self-pay

## 2020-05-09 DIAGNOSIS — N400 Enlarged prostate without lower urinary tract symptoms: Secondary | ICD-10-CM | POA: Diagnosis not present

## 2020-05-09 DIAGNOSIS — C50212 Malignant neoplasm of upper-inner quadrant of left female breast: Secondary | ICD-10-CM

## 2020-05-09 DIAGNOSIS — K5730 Diverticulosis of large intestine without perforation or abscess without bleeding: Secondary | ICD-10-CM | POA: Diagnosis not present

## 2020-05-09 DIAGNOSIS — N433 Hydrocele, unspecified: Secondary | ICD-10-CM | POA: Diagnosis not present

## 2020-05-09 DIAGNOSIS — N3289 Other specified disorders of bladder: Secondary | ICD-10-CM | POA: Diagnosis not present

## 2020-05-09 MED ORDER — GADOBENATE DIMEGLUMINE 529 MG/ML IV SOLN
15.0000 mL | Freq: Once | INTRAVENOUS | Status: AC | PRN
  Administered 2020-05-09: 15 mL via INTRAVENOUS

## 2020-05-11 ENCOUNTER — Encounter: Payer: Self-pay | Admitting: *Deleted

## 2020-05-13 ENCOUNTER — Telehealth: Payer: Self-pay | Admitting: Hematology and Oncology

## 2020-05-13 DIAGNOSIS — C50222 Malignant neoplasm of upper-inner quadrant of left male breast: Secondary | ICD-10-CM | POA: Diagnosis not present

## 2020-05-13 DIAGNOSIS — Z510 Encounter for antineoplastic radiation therapy: Secondary | ICD-10-CM | POA: Diagnosis not present

## 2020-05-13 DIAGNOSIS — Z170 Estrogen receptor positive status [ER+]: Secondary | ICD-10-CM | POA: Diagnosis not present

## 2020-05-13 NOTE — Telephone Encounter (Signed)
Scheduled appt per 11/22 sch msg - mailed reminder letter with appt date and time

## 2020-05-18 ENCOUNTER — Ambulatory Visit: Admission: RE | Admit: 2020-05-18 | Payer: PPO | Source: Ambulatory Visit | Admitting: Radiation Oncology

## 2020-05-18 DIAGNOSIS — Z510 Encounter for antineoplastic radiation therapy: Secondary | ICD-10-CM | POA: Diagnosis not present

## 2020-05-18 DIAGNOSIS — C50222 Malignant neoplasm of upper-inner quadrant of left male breast: Secondary | ICD-10-CM | POA: Diagnosis not present

## 2020-05-18 DIAGNOSIS — Z170 Estrogen receptor positive status [ER+]: Secondary | ICD-10-CM | POA: Diagnosis not present

## 2020-05-19 ENCOUNTER — Ambulatory Visit
Admission: RE | Admit: 2020-05-19 | Discharge: 2020-05-19 | Disposition: A | Discharge status: Home / Self Care | Payer: PPO | Source: Ambulatory Visit | Attending: Radiation Oncology | Admitting: Radiation Oncology

## 2020-05-19 DIAGNOSIS — Z170 Estrogen receptor positive status [ER+]: Secondary | ICD-10-CM | POA: Diagnosis not present

## 2020-05-19 DIAGNOSIS — Z510 Encounter for antineoplastic radiation therapy: Secondary | ICD-10-CM | POA: Diagnosis not present

## 2020-05-19 DIAGNOSIS — C50222 Malignant neoplasm of upper-inner quadrant of left male breast: Secondary | ICD-10-CM | POA: Diagnosis not present

## 2020-05-20 ENCOUNTER — Ambulatory Visit
Admission: RE | Admit: 2020-05-20 | Discharge: 2020-05-20 | Disposition: A | Discharge status: Home / Self Care | Payer: PPO | Source: Ambulatory Visit | Attending: Radiation Oncology | Admitting: Radiation Oncology

## 2020-05-20 DIAGNOSIS — C50222 Malignant neoplasm of upper-inner quadrant of left male breast: Secondary | ICD-10-CM | POA: Diagnosis not present

## 2020-05-20 DIAGNOSIS — Z170 Estrogen receptor positive status [ER+]: Secondary | ICD-10-CM | POA: Insufficient documentation

## 2020-05-21 ENCOUNTER — Ambulatory Visit
Admission: RE | Admit: 2020-05-21 | Discharge: 2020-05-21 | Disposition: A | Discharge status: Home / Self Care | Payer: PPO | Source: Ambulatory Visit | Attending: Radiation Oncology | Admitting: Radiation Oncology

## 2020-05-21 DIAGNOSIS — C50222 Malignant neoplasm of upper-inner quadrant of left male breast: Secondary | ICD-10-CM | POA: Diagnosis not present

## 2020-05-21 DIAGNOSIS — Z170 Estrogen receptor positive status [ER+]: Secondary | ICD-10-CM | POA: Diagnosis not present

## 2020-05-21 NOTE — Progress Notes (Signed)
Pt here for patient teaching.  Pt given Radiation and You booklet, skin care instructions, Alra deodorant and Radiaplex gel.  Reviewed areas of pertinence such as fatigue, hair loss, skin changes, breast tenderness and breast swelling . Pt able to give teach back of to pat skin and use unscented/gentle soap,apply Radiaplex bid, avoid applying anything to skin within 4 hours of treatment, avoid wearing an under wire bra and to use an electric razor if they must shave. Pt verbalizes understanding of information given and will contact nursing with any questions or concerns.     LaToya M. Silva RN, BSN      

## 2020-05-22 ENCOUNTER — Ambulatory Visit
Admission: RE | Admit: 2020-05-22 | Discharge: 2020-05-22 | Disposition: A | Discharge status: Home / Self Care | Payer: PPO | Source: Ambulatory Visit | Attending: Radiation Oncology | Admitting: Radiation Oncology

## 2020-05-22 DIAGNOSIS — Z170 Estrogen receptor positive status [ER+]: Secondary | ICD-10-CM

## 2020-05-22 DIAGNOSIS — C50222 Malignant neoplasm of upper-inner quadrant of left male breast: Secondary | ICD-10-CM | POA: Diagnosis not present

## 2020-05-22 MED ORDER — RADIAPLEXRX EX GEL: Freq: Once | CUTANEOUS | Status: AC

## 2020-05-22 MED ORDER — ALRA NON-METALLIC DEODORANT (RAD-ONC)
1.0000 "application " | Freq: Once | TOPICAL | Status: AC
  Administered 2020-05-22: 1 via TOPICAL

## 2020-05-25 ENCOUNTER — Ambulatory Visit
Admission: RE | Admit: 2020-05-25 | Discharge: 2020-05-25 | Disposition: A | Discharge status: Home / Self Care | Payer: PPO | Source: Ambulatory Visit | Attending: Radiation Oncology | Admitting: Radiation Oncology

## 2020-05-25 DIAGNOSIS — C50222 Malignant neoplasm of upper-inner quadrant of left male breast: Secondary | ICD-10-CM | POA: Diagnosis not present

## 2020-05-25 DIAGNOSIS — Z170 Estrogen receptor positive status [ER+]: Secondary | ICD-10-CM | POA: Diagnosis not present

## 2020-05-26 ENCOUNTER — Ambulatory Visit
Admission: RE | Admit: 2020-05-26 | Discharge: 2020-05-26 | Disposition: A | Discharge status: Home / Self Care | Payer: PPO | Source: Ambulatory Visit | Attending: Radiation Oncology | Admitting: Radiation Oncology

## 2020-05-26 DIAGNOSIS — C50222 Malignant neoplasm of upper-inner quadrant of left male breast: Secondary | ICD-10-CM | POA: Diagnosis not present

## 2020-05-26 DIAGNOSIS — Z170 Estrogen receptor positive status [ER+]: Secondary | ICD-10-CM | POA: Diagnosis not present

## 2020-05-27 ENCOUNTER — Ambulatory Visit
Admission: RE | Admit: 2020-05-27 | Discharge: 2020-05-27 | Disposition: A | Discharge status: Home / Self Care | Payer: PPO | Source: Ambulatory Visit | Attending: Radiation Oncology | Admitting: Radiation Oncology

## 2020-05-27 DIAGNOSIS — Z170 Estrogen receptor positive status [ER+]: Secondary | ICD-10-CM | POA: Diagnosis not present

## 2020-05-27 DIAGNOSIS — C50222 Malignant neoplasm of upper-inner quadrant of left male breast: Secondary | ICD-10-CM | POA: Diagnosis not present

## 2020-05-28 ENCOUNTER — Ambulatory Visit
Admission: RE | Admit: 2020-05-28 | Discharge: 2020-05-28 | Disposition: A | Discharge status: Home / Self Care | Payer: PPO | Source: Ambulatory Visit | Attending: Radiation Oncology | Admitting: Radiation Oncology

## 2020-05-28 DIAGNOSIS — C50222 Malignant neoplasm of upper-inner quadrant of left male breast: Secondary | ICD-10-CM | POA: Diagnosis not present

## 2020-05-28 DIAGNOSIS — Z170 Estrogen receptor positive status [ER+]: Secondary | ICD-10-CM | POA: Diagnosis not present

## 2020-05-29 ENCOUNTER — Ambulatory Visit
Admission: RE | Admit: 2020-05-29 | Discharge: 2020-05-29 | Disposition: A | Discharge status: Home / Self Care | Payer: PPO | Source: Ambulatory Visit | Attending: Radiation Oncology | Admitting: Radiation Oncology

## 2020-05-29 DIAGNOSIS — C50222 Malignant neoplasm of upper-inner quadrant of left male breast: Secondary | ICD-10-CM | POA: Diagnosis not present

## 2020-05-29 DIAGNOSIS — Z170 Estrogen receptor positive status [ER+]: Secondary | ICD-10-CM | POA: Diagnosis not present

## 2020-06-01 ENCOUNTER — Ambulatory Visit
Admission: RE | Admit: 2020-06-01 | Discharge: 2020-06-01 | Disposition: A | Discharge status: Home / Self Care | Payer: PPO | Source: Ambulatory Visit | Attending: Radiation Oncology | Admitting: Radiation Oncology

## 2020-06-01 DIAGNOSIS — Z170 Estrogen receptor positive status [ER+]: Secondary | ICD-10-CM | POA: Diagnosis not present

## 2020-06-01 DIAGNOSIS — C50222 Malignant neoplasm of upper-inner quadrant of left male breast: Secondary | ICD-10-CM | POA: Diagnosis not present

## 2020-06-02 ENCOUNTER — Ambulatory Visit: Payer: PPO | Admitting: Hematology and Oncology

## 2020-06-02 ENCOUNTER — Ambulatory Visit
Admission: RE | Admit: 2020-06-02 | Discharge: 2020-06-02 | Disposition: A | Discharge status: Home / Self Care | Payer: PPO | Source: Ambulatory Visit | Attending: Radiation Oncology | Admitting: Radiation Oncology

## 2020-06-02 DIAGNOSIS — Z170 Estrogen receptor positive status [ER+]: Secondary | ICD-10-CM | POA: Diagnosis not present

## 2020-06-02 DIAGNOSIS — C50222 Malignant neoplasm of upper-inner quadrant of left male breast: Secondary | ICD-10-CM | POA: Diagnosis not present

## 2020-06-03 ENCOUNTER — Ambulatory Visit
Admission: RE | Admit: 2020-06-03 | Discharge: 2020-06-03 | Disposition: A | Discharge status: Home / Self Care | Payer: PPO | Source: Ambulatory Visit | Attending: Radiation Oncology | Admitting: Radiation Oncology

## 2020-06-03 DIAGNOSIS — Z170 Estrogen receptor positive status [ER+]: Secondary | ICD-10-CM | POA: Diagnosis not present

## 2020-06-03 DIAGNOSIS — C50222 Malignant neoplasm of upper-inner quadrant of left male breast: Secondary | ICD-10-CM | POA: Diagnosis not present

## 2020-06-04 ENCOUNTER — Other Ambulatory Visit: Payer: Self-pay

## 2020-06-04 ENCOUNTER — Ambulatory Visit
Admission: RE | Admit: 2020-06-04 | Discharge: 2020-06-04 | Disposition: A | Discharge status: Home / Self Care | Payer: PPO | Source: Ambulatory Visit | Attending: Radiation Oncology | Admitting: Radiation Oncology

## 2020-06-04 DIAGNOSIS — Z170 Estrogen receptor positive status [ER+]: Secondary | ICD-10-CM | POA: Diagnosis not present

## 2020-06-04 DIAGNOSIS — C50222 Malignant neoplasm of upper-inner quadrant of left male breast: Secondary | ICD-10-CM | POA: Diagnosis not present

## 2020-06-05 ENCOUNTER — Other Ambulatory Visit: Payer: Self-pay

## 2020-06-05 ENCOUNTER — Ambulatory Visit
Admission: RE | Admit: 2020-06-05 | Discharge: 2020-06-05 | Disposition: A | Discharge status: Home / Self Care | Payer: PPO | Source: Ambulatory Visit | Attending: Radiation Oncology | Admitting: Radiation Oncology

## 2020-06-05 DIAGNOSIS — C50222 Malignant neoplasm of upper-inner quadrant of left male breast: Secondary | ICD-10-CM | POA: Diagnosis not present

## 2020-06-05 DIAGNOSIS — Z170 Estrogen receptor positive status [ER+]: Secondary | ICD-10-CM | POA: Diagnosis not present

## 2020-06-05 MED ORDER — RADIAPLEXRX EX GEL: Freq: Once | CUTANEOUS | Status: AC

## 2020-06-08 ENCOUNTER — Ambulatory Visit
Admission: RE | Admit: 2020-06-08 | Discharge: 2020-06-08 | Disposition: A | Discharge status: Home / Self Care | Payer: PPO | Source: Ambulatory Visit | Attending: Radiation Oncology | Admitting: Radiation Oncology

## 2020-06-08 DIAGNOSIS — Z170 Estrogen receptor positive status [ER+]: Secondary | ICD-10-CM | POA: Diagnosis not present

## 2020-06-08 DIAGNOSIS — C50222 Malignant neoplasm of upper-inner quadrant of left male breast: Secondary | ICD-10-CM | POA: Diagnosis not present

## 2020-06-09 ENCOUNTER — Ambulatory Visit
Admission: RE | Admit: 2020-06-09 | Discharge: 2020-06-09 | Disposition: A | Discharge status: Home / Self Care | Payer: PPO | Source: Ambulatory Visit | Attending: Radiation Oncology | Admitting: Radiation Oncology

## 2020-06-09 DIAGNOSIS — Z170 Estrogen receptor positive status [ER+]: Secondary | ICD-10-CM | POA: Diagnosis not present

## 2020-06-09 DIAGNOSIS — C50222 Malignant neoplasm of upper-inner quadrant of left male breast: Secondary | ICD-10-CM | POA: Diagnosis not present

## 2020-06-09 NOTE — Progress Notes (Signed)
Patient Care Team: Plotnikov, Evie Lacks, MD as PCP - General (Internal Medicine) Rockwell Germany, RN as Oncology Nurse Navigator Mauro Kaufmann, RN as Oncology Nurse Navigator Erroll Luna, MD as Consulting Physician (General Surgery) Nicholas Lose, MD as Consulting Physician (Hematology and Oncology) Kyung Rudd, MD as Consulting Physician (Radiation Oncology)  DIAGNOSIS:    ICD-10-CM   1. Malignant neoplasm of upper-inner quadrant of left breast in male, estrogen receptor positive ([place])  C50.222    Z17.0     SUMMARY OF ONCOLOGIC HISTORY: Oncology History  Malignant neoplasm of upper-inner quadrant of left breast in male, estrogen receptor positive ([place])  03/12/2019 Initial Diagnosis   Patient palpated a left breast mass. Diagnostic mammogram and US showed no right breast malignancy and a 3.0cm mass in the upper inner quadrant of the left breast with a promnent left axillary lymph node. Biopsy showed invasive mammary carcinoma in the breast and axilla, grade 2, HER-2 equivocal by IHC (2+), ER/PR+ >95%, Ki67 25%.   04/02/2020 Surgery   Left mastectomy ([facility]): invasive and in situ ductal carcinoma, 2.8cm, clear margins, and 1/2 left axillary lymph nodes positive for carcinoma, 1.1cm.    05/19/2020 -  Radiation Therapy   Adjuvant radiation     CHIEF COMPLIANT: Follow-up of left breast cancer to discuss antiestrogen therapy  INTERVAL HISTORY: Danny Gutierrez is a 74 y.o. with above-mentioned history of left breast cancer who underwent a left mastectomy and is currently on radiation therapy. Pelvic MRI on 05/09/20 showed no evidence of metastatic disease. He presents to the clinic today to discuss antiestrogen therapy.  He is tolerating radiation extremely well without any problems.  ALLERGIES:  is allergic to sulfa antibiotics.  MEDICATIONS:  Current Outpatient Medications  Medication Sig Dispense Refill  . acetaminophen (TYLENOL) 500 MG tablet Take 500 mg by mouth  every 6 (six) hours as needed.    . Ascorbic Acid (SM VITAMIN C) 500 MG CHEW Chew by mouth.    Marland Kitchen aspirin EC 81 MG tablet Take 81 mg by mouth daily. Swallow whole.    . Cholecalciferol 1000 UNITS tablet Take 1,000 Units by mouth daily.    . dorzolamide-timolol (COSOPT) 22.3-6.8 MG/ML ophthalmic solution INT 1 GTT IN OU BID  6  . Melatonin 10 MG TABS Take by mouth.    . Multiple Minerals-Vitamins (CALCIUM-MAGNESIUM-ZINC-D3 PO) Take by mouth.    . Multiple Vitamins-Minerals (CENTRUM SILVER ADULT 50+ PO) Take by mouth.    . Multiple Vitamins-Minerals (OCUVITE EYE HEALTH FORMULA PO) Take by mouth daily.    . Multiple Vitamins-Minerals (ONE-A-DAY PROACTIVE 65+ PO) Take by mouth.    Marland Kitchen OVER THE COUNTER MEDICATION Focus Factor    . Pollen Extracts (PROSTAT PO) Take by mouth. prostagenix (Patient not taking: Reported on 05/07/2020)    . SUPER B COMPLEX/C PO Take by mouth.     No current facility-administered medications for this visit.    PHYSICAL EXAMINATION: ECOG PERFORMANCE STATUS: 1 - Symptomatic but completely ambulatory  Vitals:   06/10/20 1130  BP: 132/84  Pulse: 77  Resp: 18  Temp: 98.3 F (36.8 C)  SpO2: 96%   Filed Weights   06/10/20 1130  Weight: 161 lb 1.6 oz (73.1 kg)    LABORATORY DATA:  I have reviewed the data as listed CMP Latest Ref Rng & Units 03/18/2020 02/06/2020 02/05/2019  Glucose 70 - 99 mg/dL 122(H) 114(H) 110(H)  BUN 8 - 23 mg/dL 12 14 14   Creatinine 0.61 - 1.24 mg/dL 0.78 0.73 0.72  Sodium 135 - 145 mmol/L 139 140 139  Potassium 3.5 - 5.1 mmol/L 4.0 4.7 4.1  Chloride 98 - 111 mmol/L 105 106 105  CO2 22 - 32 mmol/L 29 26 26   Calcium 8.9 - 10.3 mg/dL 9.6 9.4 9.6  Total Protein 6.5 - 8.1 g/dL 7.4 6.8 7.1  Total Bilirubin 0.3 - 1.2 mg/dL 0.6 0.6 0.7  Alkaline Phos 38 - 126 U/L 69 - 57  AST 15 - 41 U/L 16 17 15   ALT 0 - 44 U/L 21 19 18     Lab Results  Component Value Date   WBC 9.0 03/18/2020   HGB 15.1 03/18/2020   HCT 45.0 03/18/2020   MCV 83.8  03/18/2020   PLT 199 03/18/2020   NEUTROABS 6.1 03/18/2020    ASSESSMENT & PLAN:  Malignant neoplasm of upper-inner quadrant of left breast in male, estrogen receptor positive ([place]) 04/02/2020:Left mastectomy ([facility]): invasive and in situ ductal carcinoma, 2.8cm, clear margins, and 1/2 left axillary lymph nodes positive for carcinoma with extranodal extension, 1.1cm. grade 2, HER-2 equivocal by IHC (2+) FISH negative, ER/PR+ >95%, Ki67 25%.  MammaPrint: Luminal type A, low risk (no benefit from chemotherapy)  Treatment plan: 1. adjuvant radiation started 05/19/2020 (will complete 07/06/2020) 2. Adjuvant antiestrogen therapywith tamoxifen x10 years  Tamoxifen counseling:We discussed the risks and benefits of tamoxifen. These include but not limited to insomnia, hot flashes, mood changes, vaginal dryness, and weight gain. Although rare, serious side effects includingrisk of blood clots were also discussed. We strongly believe that the benefits far outweigh the risks. Patient understands these risks and consented to starting treatment. Planned treatment duration is 10 years. We will stop tamoxifen 08/04/2021. Return to clinic in 4 months for survivorship care plan visit  No orders of the defined types were placed in this encounter.  The patient has a good understanding of the overall plan. he agrees with it. he will call with any problems that may develop before the next visit here.  Total time spent: 30 mins including face to face time and time spent for planning, charting and coordination of care  Nicholas Lose, MD 06/10/2020  I, Cloyde Reams Dorshimer, am acting as scribe for Dr. Nicholas Lose.  I have reviewed the above documentation for accuracy and completeness, and I agree with the above.

## 2020-06-10 ENCOUNTER — Telehealth: Payer: Self-pay | Admitting: Hematology and Oncology

## 2020-06-10 ENCOUNTER — Ambulatory Visit
Admission: RE | Admit: 2020-06-10 | Discharge: 2020-06-10 | Disposition: A | Discharge status: Home / Self Care | Payer: PPO | Source: Ambulatory Visit | Attending: Radiation Oncology | Admitting: Radiation Oncology

## 2020-06-10 ENCOUNTER — Other Ambulatory Visit: Payer: Self-pay

## 2020-06-10 ENCOUNTER — Inpatient Hospital Stay: Payer: PPO | Attending: Hematology and Oncology | Admitting: Hematology and Oncology

## 2020-06-10 DIAGNOSIS — Z9012 Acquired absence of left breast and nipple: Secondary | ICD-10-CM | POA: Diagnosis not present

## 2020-06-10 DIAGNOSIS — C50222 Malignant neoplasm of upper-inner quadrant of left male breast: Secondary | ICD-10-CM | POA: Diagnosis not present

## 2020-06-10 DIAGNOSIS — Z170 Estrogen receptor positive status [ER+]: Secondary | ICD-10-CM | POA: Insufficient documentation

## 2020-06-10 MED ORDER — TAMOXIFEN CITRATE 20 MG PO TABS: 20.0000 mg | ORAL_TABLET | Freq: Every day | ORAL | 3 refills | Status: DC

## 2020-06-10 NOTE — Telephone Encounter (Signed)
Scheduled appts per 12/22 los. Gave pt a print out of AVS.  

## 2020-06-10 NOTE — Assessment & Plan Note (Signed)
04/02/2020:Left mastectomy ([facility]): invasive and in situ ductal carcinoma, 2.8cm, clear margins, and 1/2 left axillary lymph nodes positive for carcinoma with extranodal extension, 1.1cm. grade 2, HER-2 equivocal by IHC (2+) FISH negative, ER/PR+ >95%, Ki67 25%.  MammaPrint: Luminal type A, low risk (no benefit from chemotherapy)  Treatment plan: 1. adjuvant radiation started 05/19/2020 2. Adjuvant antiestrogen therapywith tamoxifen x10 years  Tamoxifen counseling:We discussed the risks and benefits of tamoxifen. These include but not limited to insomnia, hot flashes, mood changes, vaginal dryness, and weight gain. Although rare, serious side effects includingrisk of blood clots were also discussed. We strongly believe that the benefits far outweigh the risks. Patient understands these risks and consented to starting treatment. Planned treatment duration is 10 years.  Return to clinic in 3 months for survivorship care plan visit    Return to clinic based upon MammaPrint test result.

## 2020-06-11 ENCOUNTER — Ambulatory Visit
Admission: RE | Admit: 2020-06-11 | Discharge: 2020-06-11 | Disposition: A | Discharge status: Home / Self Care | Payer: PPO | Source: Ambulatory Visit | Attending: Radiation Oncology | Admitting: Radiation Oncology

## 2020-06-11 ENCOUNTER — Ambulatory Visit: Payer: PPO | Admitting: Radiation Oncology

## 2020-06-11 DIAGNOSIS — Z170 Estrogen receptor positive status [ER+]: Secondary | ICD-10-CM | POA: Diagnosis not present

## 2020-06-11 DIAGNOSIS — C50222 Malignant neoplasm of upper-inner quadrant of left male breast: Secondary | ICD-10-CM | POA: Diagnosis not present

## 2020-06-15 ENCOUNTER — Ambulatory Visit
Admission: RE | Admit: 2020-06-15 | Discharge: 2020-06-15 | Disposition: A | Discharge status: Home / Self Care | Payer: PPO | Source: Ambulatory Visit | Attending: Radiation Oncology | Admitting: Radiation Oncology

## 2020-06-15 DIAGNOSIS — Z170 Estrogen receptor positive status [ER+]: Secondary | ICD-10-CM | POA: Diagnosis not present

## 2020-06-15 DIAGNOSIS — C50222 Malignant neoplasm of upper-inner quadrant of left male breast: Secondary | ICD-10-CM | POA: Diagnosis not present

## 2020-06-16 ENCOUNTER — Ambulatory Visit: Payer: PPO

## 2020-06-17 ENCOUNTER — Ambulatory Visit
Admission: RE | Admit: 2020-06-17 | Discharge: 2020-06-17 | Disposition: A | Discharge status: Home / Self Care | Payer: PPO | Source: Ambulatory Visit | Attending: Radiation Oncology | Admitting: Radiation Oncology

## 2020-06-17 DIAGNOSIS — Z170 Estrogen receptor positive status [ER+]: Secondary | ICD-10-CM | POA: Diagnosis not present

## 2020-06-17 DIAGNOSIS — C50222 Malignant neoplasm of upper-inner quadrant of left male breast: Secondary | ICD-10-CM | POA: Diagnosis not present

## 2020-06-18 ENCOUNTER — Ambulatory Visit: Payer: PPO | Admitting: Radiation Oncology

## 2020-06-18 ENCOUNTER — Ambulatory Visit
Admission: RE | Admit: 2020-06-18 | Discharge: 2020-06-18 | Disposition: A | Discharge status: Home / Self Care | Payer: PPO | Source: Ambulatory Visit | Attending: Radiation Oncology | Admitting: Radiation Oncology

## 2020-06-18 DIAGNOSIS — Z170 Estrogen receptor positive status [ER+]: Secondary | ICD-10-CM | POA: Diagnosis not present

## 2020-06-18 DIAGNOSIS — C50222 Malignant neoplasm of upper-inner quadrant of left male breast: Secondary | ICD-10-CM | POA: Diagnosis not present

## 2020-06-22 ENCOUNTER — Ambulatory Visit
Admission: RE | Admit: 2020-06-22 | Discharge: 2020-06-22 | Disposition: A | Discharge status: Home / Self Care | Payer: PPO | Source: Ambulatory Visit | Attending: Radiation Oncology | Admitting: Radiation Oncology

## 2020-06-22 DIAGNOSIS — Z170 Estrogen receptor positive status [ER+]: Secondary | ICD-10-CM | POA: Diagnosis not present

## 2020-06-22 DIAGNOSIS — C50222 Malignant neoplasm of upper-inner quadrant of left male breast: Secondary | ICD-10-CM | POA: Diagnosis not present

## 2020-06-23 ENCOUNTER — Ambulatory Visit
Admission: RE | Admit: 2020-06-23 | Discharge: 2020-06-23 | Disposition: A | Discharge status: Home / Self Care | Payer: PPO | Source: Ambulatory Visit | Attending: Radiation Oncology | Admitting: Radiation Oncology

## 2020-06-23 DIAGNOSIS — Z170 Estrogen receptor positive status [ER+]: Secondary | ICD-10-CM | POA: Diagnosis not present

## 2020-06-23 DIAGNOSIS — C50222 Malignant neoplasm of upper-inner quadrant of left male breast: Secondary | ICD-10-CM | POA: Diagnosis not present

## 2020-06-24 ENCOUNTER — Ambulatory Visit
Admission: RE | Admit: 2020-06-24 | Discharge: 2020-06-24 | Disposition: A | Discharge status: Home / Self Care | Payer: PPO | Source: Ambulatory Visit | Attending: Radiation Oncology | Admitting: Radiation Oncology

## 2020-06-24 DIAGNOSIS — Z170 Estrogen receptor positive status [ER+]: Secondary | ICD-10-CM | POA: Diagnosis not present

## 2020-06-24 DIAGNOSIS — C50222 Malignant neoplasm of upper-inner quadrant of left male breast: Secondary | ICD-10-CM | POA: Diagnosis not present

## 2020-06-25 ENCOUNTER — Ambulatory Visit
Admission: RE | Admit: 2020-06-25 | Discharge: 2020-06-25 | Disposition: A | Discharge status: Home / Self Care | Payer: PPO | Source: Ambulatory Visit | Attending: Radiation Oncology | Admitting: Radiation Oncology

## 2020-06-25 DIAGNOSIS — C50222 Malignant neoplasm of upper-inner quadrant of left male breast: Secondary | ICD-10-CM | POA: Diagnosis not present

## 2020-06-25 DIAGNOSIS — Z170 Estrogen receptor positive status [ER+]: Secondary | ICD-10-CM | POA: Diagnosis not present

## 2020-06-26 ENCOUNTER — Encounter: Payer: Self-pay | Admitting: Radiation Oncology

## 2020-06-26 ENCOUNTER — Ambulatory Visit
Admission: RE | Admit: 2020-06-26 | Discharge: 2020-06-26 | Disposition: A | Discharge status: Home / Self Care | Payer: PPO | Source: Ambulatory Visit | Attending: Radiation Oncology | Admitting: Radiation Oncology

## 2020-06-26 ENCOUNTER — Ambulatory Visit: Payer: PPO | Admitting: Radiation Oncology

## 2020-06-26 DIAGNOSIS — Z170 Estrogen receptor positive status [ER+]: Secondary | ICD-10-CM | POA: Diagnosis not present

## 2020-06-26 DIAGNOSIS — C50222 Malignant neoplasm of upper-inner quadrant of left male breast: Secondary | ICD-10-CM | POA: Diagnosis not present

## 2020-06-26 MED ORDER — RADIAPLEXRX EX GEL: Freq: Once | CUTANEOUS | Status: AC

## 2020-06-29 ENCOUNTER — Ambulatory Visit
Admission: RE | Admit: 2020-06-29 | Discharge: 2020-06-29 | Disposition: A | Discharge status: Home / Self Care | Payer: PPO | Source: Ambulatory Visit | Attending: Radiation Oncology | Admitting: Radiation Oncology

## 2020-06-29 DIAGNOSIS — Z170 Estrogen receptor positive status [ER+]: Secondary | ICD-10-CM | POA: Diagnosis not present

## 2020-06-29 DIAGNOSIS — C50222 Malignant neoplasm of upper-inner quadrant of left male breast: Secondary | ICD-10-CM | POA: Diagnosis not present

## 2020-06-30 ENCOUNTER — Ambulatory Visit: Payer: PPO

## 2020-07-01 ENCOUNTER — Ambulatory Visit: Payer: PPO

## 2020-07-01 ENCOUNTER — Ambulatory Visit
Admission: RE | Admit: 2020-07-01 | Discharge: 2020-07-01 | Disposition: A | Discharge status: Home / Self Care | Payer: PPO | Source: Ambulatory Visit | Attending: Radiation Oncology | Admitting: Radiation Oncology

## 2020-07-01 DIAGNOSIS — Z170 Estrogen receptor positive status [ER+]: Secondary | ICD-10-CM | POA: Diagnosis not present

## 2020-07-01 DIAGNOSIS — C50222 Malignant neoplasm of upper-inner quadrant of left male breast: Secondary | ICD-10-CM | POA: Diagnosis not present

## 2020-07-02 ENCOUNTER — Ambulatory Visit
Admission: RE | Admit: 2020-07-02 | Discharge: 2020-07-02 | Disposition: A | Discharge status: Home / Self Care | Payer: PPO | Source: Ambulatory Visit | Attending: Radiation Oncology | Admitting: Radiation Oncology

## 2020-07-02 ENCOUNTER — Ambulatory Visit: Payer: PPO

## 2020-07-02 DIAGNOSIS — C50222 Malignant neoplasm of upper-inner quadrant of left male breast: Secondary | ICD-10-CM | POA: Diagnosis not present

## 2020-07-03 ENCOUNTER — Ambulatory Visit: Payer: PPO

## 2020-07-03 ENCOUNTER — Ambulatory Visit
Admission: RE | Admit: 2020-07-03 | Discharge: 2020-07-03 | Disposition: A | Discharge status: Home / Self Care | Payer: PPO | Source: Ambulatory Visit | Attending: Radiation Oncology | Admitting: Radiation Oncology

## 2020-07-03 DIAGNOSIS — Z170 Estrogen receptor positive status [ER+]: Secondary | ICD-10-CM

## 2020-07-03 DIAGNOSIS — C50222 Malignant neoplasm of upper-inner quadrant of left male breast: Secondary | ICD-10-CM | POA: Diagnosis not present

## 2020-07-03 MED ORDER — RADIAPLEXRX EX GEL: Freq: Once | CUTANEOUS | Status: AC

## 2020-07-06 ENCOUNTER — Encounter: Payer: Self-pay | Admitting: *Deleted

## 2020-07-06 ENCOUNTER — Ambulatory Visit: Payer: PPO

## 2020-07-07 ENCOUNTER — Ambulatory Visit: Payer: PPO

## 2020-07-08 ENCOUNTER — Ambulatory Visit: Payer: PPO

## 2020-07-08 ENCOUNTER — Ambulatory Visit
Admission: RE | Admit: 2020-07-08 | Discharge: 2020-07-08 | Disposition: A | Discharge status: Home / Self Care | Payer: PPO | Source: Ambulatory Visit | Attending: Radiation Oncology | Admitting: Radiation Oncology

## 2020-07-08 DIAGNOSIS — C50222 Malignant neoplasm of upper-inner quadrant of left male breast: Secondary | ICD-10-CM | POA: Diagnosis not present

## 2020-07-09 ENCOUNTER — Ambulatory Visit
Admission: RE | Admit: 2020-07-09 | Discharge: 2020-07-09 | Disposition: A | Discharge status: Home / Self Care | Payer: PPO | Source: Ambulatory Visit | Attending: Radiation Oncology | Admitting: Radiation Oncology

## 2020-07-09 ENCOUNTER — Ambulatory Visit: Payer: PPO

## 2020-07-09 DIAGNOSIS — C50222 Malignant neoplasm of upper-inner quadrant of left male breast: Secondary | ICD-10-CM | POA: Diagnosis not present

## 2020-07-10 ENCOUNTER — Ambulatory Visit: Payer: PPO

## 2020-07-13 ENCOUNTER — Other Ambulatory Visit: Payer: Self-pay

## 2020-07-13 ENCOUNTER — Ambulatory Visit
Admission: RE | Admit: 2020-07-13 | Discharge: 2020-07-13 | Disposition: A | Discharge status: Home / Self Care | Payer: PPO | Source: Ambulatory Visit | Attending: Radiation Oncology | Admitting: Radiation Oncology

## 2020-07-13 DIAGNOSIS — Z170 Estrogen receptor positive status [ER+]: Secondary | ICD-10-CM | POA: Diagnosis not present

## 2020-07-13 DIAGNOSIS — C50222 Malignant neoplasm of upper-inner quadrant of left male breast: Secondary | ICD-10-CM | POA: Diagnosis not present

## 2020-07-14 NOTE — Progress Notes (Signed)
  Radiation Oncology         [phone]) [phone] ________________________________  Name: Danny Gutierrez MRN: [id number]  Date: 06/26/2020  DOB: [birth year]  SIMULATION NOTE   NARRATIVE:  The patient underwent simulation today for ongoing radiation therapy.  The patient underwent a clinical setup to define the target region for the boost. The high risk region around the surgical incision was identified and outlined for treatment planning.   Treatment planning then occurred.  The radiation boost prescription was entered and confirmed.  A total of 1 complex treatment devices were fabricated for a customized block to shape radiation around the target while maximally excluding nearby normal structures. I have requested :electron Plan.    PLAN:  This modified radiation beam arrangement is intended to continue the current radiation dose to an additional 10 Gy in 5 fractions for a total cumulative dose of 60.4 Gy.    ------------------------------------------------  Jodelle Gross, MD, PhD

## 2020-07-20 ENCOUNTER — Encounter: Payer: Self-pay | Admitting: Radiation Oncology

## 2020-07-20 NOTE — Progress Notes (Signed)
  Radiation Oncology         [id number]) [phone] ________________________________  Name: Danny Gutierrez MRN: [id number]  Date: 07/20/2020  DOB: [birth year]  End of Treatment Note  Diagnosis:   Stage IIA, pT2N1aM0 grade 2, ER/PR positive invasive ductal carinoma of the left male breast.   Indication for treatment:  Curative       Radiation treatment dates: 05/19/20-07/13/20  Site/dose:   The patient initially received a dose of 50.4 Gy in 28 fractions to the left chest wall and supraclavicular region. This was delivered using a 3-D conformal, 4 field technique. The patient then received a boost to the mastectomy scar. This delivered an additional 10 Gy in 5 fractions using an en face electron field. The total dose was 60.4 Gy.  Narrative: The patient tolerated radiation treatment relatively well.   The patient had some expected skin irritation as he progressed during treatment. Moist desquamation was not present at the end of treatment.  Plan: The patient has completed radiation treatment. The patient will receive a call from radiation oncology clinic for routine followup in one month. He was advised to call or return sooner with questions or concerns related to  recovery or treatment. ________________________________     Carola Rhine, PAC

## 2020-08-02 ENCOUNTER — Encounter: Payer: Self-pay | Admitting: Hematology and Oncology

## 2020-08-03 ENCOUNTER — Ambulatory Visit: Payer: PPO | Attending: Surgery

## 2020-08-03 ENCOUNTER — Other Ambulatory Visit: Payer: Self-pay

## 2020-08-03 DIAGNOSIS — Z483 Aftercare following surgery for neoplasm: Secondary | ICD-10-CM

## 2020-08-03 NOTE — Therapy (Signed)
, [place], [place], [address] Phone: [phone]   Fax:  [phone]  Physical Therapy Treatment  Patient Details  Name: Danny Gutierrez MRN: [id number] Date of Birth: December 12, 1945 Referring Provider (PT): Dr. Erroll Luna   Encounter Date: 08/03/2020   PT End of Session - 08/03/20 0948    Visit Number 2   # unchanged due to screen only   Number of Visits 2    Date for PT Re-Evaluation 05/13/20    PT Start Time 0942    PT Stop Time 0955    PT Time Calculation (min) 13 min    Activity Tolerance Patient tolerated treatment well    Behavior During Therapy [facility] for tasks assessed/performed           Past Medical History:  Diagnosis Date  . Breast cancer ([place])   . Family history of breast cancer     Past Surgical History:  Procedure Laterality Date  . ADENOIDECTOMY  1957  . ADENOIDECTOMY     childhood  . MASTECTOMY W/ SENTINEL NODE BIOPSY Left 04/02/2020   Procedure: LEFT MASTECTOMY WITH LEFT TARGETED RADIOACTIVE SEED  LYMPH NODE BIOPSY AND LEFT SENTINEL LYMPH NODE MAPPING;  Surgeon: Erroll Luna, MD;  Location: [place];  Service: General;  Laterality: Left;    There were no vitals filed for this visit.   Subjective Assessment - 08/03/20 0944    Subjective Pt returns for 3 month L-Dex screen.    Pertinent History Patient was diagnosed on 03/04/2020 with left grade II invasive ductal carcinoma with DCIS breast cancer. It measures 2.3 cm and is located in the upper inner quadrant. It is ER/PR positive and HER2 negative with a Ki67 of 25%. He has a positive axillary lymph node.on 07/04/2019 he had left mastectomy with 1/2 left axillary lymph nodes positive for carcinoma.  He will not have to have chemotherapy but will have radiation.  Past histroy includes glaucoma                  L-DEX FLOWSHEETS - 08/03/20 0900      L-DEX LYMPHEDEMA SCREENING   Measurement  Type Unilateral    L-DEX MEASUREMENT EXTREMITY Upper Extremity    POSITION  Standing    DOMINANT SIDE Right    At Risk Side Left    BASELINE SCORE (UNILATERAL) 3.3    L-DEX SCORE (UNILATERAL) 2.1    VALUE CHANGE (UNILAT) -1.2                                  PT Long Term Goals - 04/22/20 1350      PT LONG TERM GOAL #1   Title Patient will demonstrate he has regained full shoulder ROM and function post operatively compared to baselines.    Baseline Pt lacks a few degress from baselin but had adeqeuate ROM for radiation    Period Weeks    Status Partially Met                 Plan - 08/03/20 0955    Clinical Impression Statement Danny Gutierrez returns for his 3 month L-Dex screen. His change from baseline of -1.2 is WNLs so no further treatment is required at this time except to cont every 3 month L-Dex screens. Pt is agreeable to this and interpreter present for todays session to answer pts questions and for explaination of todays  treatment.    PT Next Visit Plan Cont every 3 month L-Dex screens for up to 2 years from his SLNB surgery.    Consulted and Agree with Plan of Care Patient           Patient will benefit from skilled therapeutic intervention in order to improve the following deficits and impairments:     Visit Diagnosis: Aftercare following surgery for neoplasm     Problem List Patient Active Problem List   Diagnosis Date Noted  . Family history of breast cancer   . Malignant neoplasm of upper-inner quadrant of left breast in male, estrogen receptor positive ([place]) 03/16/2020  . Gynecomastia 02/06/2020  . Elevated BP without diagnosis of hypertension 02/05/2019  . Lip lesion 01/25/2018  . Glaucoma 01/24/2017  . Cough 01/13/2016  . Palpitations 01/12/2015  . Prostate nodule 01/12/2015  . Benign cystic mucinous tumor 01/08/2013  . Sebaceous cyst 01/24/2012  . Well adult exam 03/28/2011    [name], PTA 08/03/2020,  9:58 AM  [place], [place], [address] Phone: [phone]   Fax:  [phone]  Name: Danny Gutierrez MRN: [id number] Date of Birth: 09/02/1945

## 2020-08-05 NOTE — Progress Notes (Signed)
  Radiation Oncology         [id number]) [phone] ________________________________  Name: Danny Gutierrez MRN: [id number]  Date of Service: 08/10/2020  DOB: Jan 10, 1946  Post Treatment Telephone Note  Diagnosis:   Stage IIA,pT2N1aM0 grade 2, ER/PR positive invasive ductalcarinoma of the left male breast.  Interval Since Last Radiation:  8 weeks    05/19/20-07/13/20 The patient initially received a dose of 50.4 Gy in 28 fractions to the left chest wall and supraclavicular region. This was delivered using a 3-D conformal, 4 field technique. The patient then received a boost to the mastectomy scar. This delivered an additional 10 Gy in 5 fractions using an en face electron field. The total dose was 60.4 Gy.   Narrative:  The patient was contacted today for routine follow-up. During treatment she did very well with radiotherapy and did not have significant desquamation. He reports he that he is doing well and no issues. Advised interpreter to have patient use SPF of 30 or higher.   Impression/Plan: 1. Stage IIA,pT2N1aM0 grade 2, ER/PR positive invasive ductalcarinoma of the left male breast. The patient has been doing well since completion of radiotherapy. We discussed that we would be happy to continue to follow her as needed, but she will also continue to follow up with Dr. Lindi Adie in medical oncology. He was counseled on skin care as well as measures to avoid sun exposure to this area.  2. Survivorship. We discussed the importance of survivorship evaluation and encouraged her to attend his upcoming visit with that clinic.     Loma Messing, LPN

## 2020-08-10 ENCOUNTER — Other Ambulatory Visit: Payer: Self-pay

## 2020-08-10 ENCOUNTER — Ambulatory Visit
Admission: RE | Admit: 2020-08-10 | Discharge: 2020-08-10 | Disposition: A | Discharge status: Home / Self Care | Payer: PPO | Source: Ambulatory Visit | Attending: Radiation Oncology | Admitting: Radiation Oncology

## 2020-08-10 ENCOUNTER — Telehealth: Payer: Self-pay

## 2020-08-10 DIAGNOSIS — C50222 Malignant neoplasm of upper-inner quadrant of left male breast: Secondary | ICD-10-CM | POA: Insufficient documentation

## 2020-08-10 DIAGNOSIS — Z170 Estrogen receptor positive status [ER+]: Secondary | ICD-10-CM | POA: Insufficient documentation

## 2020-08-10 NOTE — Telephone Encounter (Signed)
Spoke with patient and interpreter in regards to post radiation treatment. Interpreter verbalized understanding of post treatment information given. TM

## 2020-08-12 DIAGNOSIS — H5203 Hypermetropia, bilateral: Secondary | ICD-10-CM | POA: Diagnosis not present

## 2020-08-12 DIAGNOSIS — H2513 Age-related nuclear cataract, bilateral: Secondary | ICD-10-CM | POA: Diagnosis not present

## 2020-08-12 DIAGNOSIS — H268 Other specified cataract: Secondary | ICD-10-CM | POA: Diagnosis not present

## 2020-08-12 DIAGNOSIS — H52223 Regular astigmatism, bilateral: Secondary | ICD-10-CM | POA: Diagnosis not present

## 2020-08-12 DIAGNOSIS — H524 Presbyopia: Secondary | ICD-10-CM | POA: Diagnosis not present

## 2020-08-12 DIAGNOSIS — H401431 Capsular glaucoma with pseudoexfoliation of lens, bilateral, mild stage: Secondary | ICD-10-CM | POA: Diagnosis not present

## 2020-09-04 DIAGNOSIS — C50922 Malignant neoplasm of unspecified site of left male breast: Secondary | ICD-10-CM | POA: Diagnosis not present

## 2020-09-09 ENCOUNTER — Telehealth: Payer: Self-pay | Admitting: Adult Health

## 2020-09-09 NOTE — Telephone Encounter (Signed)
Called pt to confirm appt. Used interpreter line to leave a msg for pt with appt date and time.

## 2020-10-08 ENCOUNTER — Encounter: Payer: PPO | Admitting: Adult Health

## 2020-10-14 DIAGNOSIS — H401431 Capsular glaucoma with pseudoexfoliation of lens, bilateral, mild stage: Secondary | ICD-10-CM | POA: Diagnosis not present

## 2020-10-15 ENCOUNTER — Other Ambulatory Visit: Payer: Self-pay

## 2020-10-15 ENCOUNTER — Inpatient Hospital Stay: Payer: PPO | Attending: Adult Health | Admitting: Adult Health

## 2020-10-15 ENCOUNTER — Encounter: Payer: Self-pay | Admitting: Adult Health

## 2020-10-15 VITALS — BP 135/66 | HR 71 | Temp 97.8°F | Resp 18 | Ht 66.0 in | Wt 160.2 lb

## 2020-10-15 DIAGNOSIS — Z170 Estrogen receptor positive status [ER+]: Secondary | ICD-10-CM | POA: Insufficient documentation

## 2020-10-15 DIAGNOSIS — Z79810 Long term (current) use of selective estrogen receptor modulators (SERMs): Secondary | ICD-10-CM | POA: Insufficient documentation

## 2020-10-15 DIAGNOSIS — R911 Solitary pulmonary nodule: Secondary | ICD-10-CM | POA: Diagnosis not present

## 2020-10-15 DIAGNOSIS — R918 Other nonspecific abnormal finding of lung field: Secondary | ICD-10-CM

## 2020-10-15 DIAGNOSIS — N6310 Unspecified lump in the right breast, unspecified quadrant: Secondary | ICD-10-CM | POA: Insufficient documentation

## 2020-10-15 DIAGNOSIS — Z9012 Acquired absence of left breast and nipple: Secondary | ICD-10-CM | POA: Diagnosis not present

## 2020-10-15 DIAGNOSIS — C50222 Malignant neoplasm of upper-inner quadrant of left male breast: Secondary | ICD-10-CM | POA: Diagnosis not present

## 2020-10-15 NOTE — Progress Notes (Signed)
SURVIVORSHIP VISIT:    BRIEF ONCOLOGIC HISTORY:  Oncology History  Malignant neoplasm of upper-inner quadrant of left breast in male, estrogen receptor positive ([place])  03/12/2019 Initial Diagnosis   Patient palpated a left breast mass. Diagnostic mammogram and US showed no right breast malignancy and a 3.0cm mass in the upper inner quadrant of the left breast with a promnent left axillary lymph node. Biopsy showed invasive mammary carcinoma in the breast and axilla, grade 2, HER-2 equivocal by IHC (2+), ER/PR+ >95%, Ki67 25%.   07/2019 - 07/2029 Anti-estrogen oral therapy   Tamoxifen   04/02/2020 Surgery   Left mastectomy ([facility]) [phone]): invasive and in situ ductal carcinoma, 2.8cm, clear margins, and 1/2 left axillary lymph nodes positive for carcinoma, 1.1cm.    04/02/2020 Cancer Staging   Staging form: Breast, AJCC 8th Edition - Pathologic stage from 04/02/2020: Stage IB (pT2, pN1a, cM0, G2, ER+, PR+, HER2-) Stage prefix: Initial diagnosis Histologic grading system: 3 grade system   04/13/2020 Oncotype testing   Mammaprint: low risk   05/19/2020 - 07/13/2020 Radiation Therapy   The patient initially received a dose of 50.4 Gy in 28 fractions to the left chest wall and supraclavicular region. This was delivered using a 3-D conformal, 4 field technique. The patient then received a boost to the mastectomy scar. This delivered an additional 10 Gy in 5 fractions using an en face electron field. The total dose was 60.4 Gy.     INTERVAL HISTORY:  Danny Gutierrez to review her survivorship care plan detailing her treatment course for breast cancer, as well as monitoring long-term side effects of that treatment, education regarding health maintenance, screening, and overall wellness and health promotion.     Overall, Danny Gutierrez reports feeling quite well.  He is taking Tamoxifen daily.  He tolerates this well with no issues.    REVIEW OF SYSTEMS:  Review of Systems   Constitutional: Negative for appetite change, chills, fatigue, fever and unexpected weight change.  HENT:   Negative for hearing loss, lump/mass and trouble swallowing.   Eyes: Negative for eye problems and icterus.  Respiratory: Negative for chest tightness, cough and shortness of breath.   Cardiovascular: Negative for chest pain, leg swelling and palpitations.  Gastrointestinal: Negative for abdominal distention, abdominal pain, constipation, diarrhea, nausea and vomiting.  Endocrine: Negative for hot flashes.  Genitourinary: Negative for difficulty urinating.   Musculoskeletal: Negative for arthralgias.  Skin: Negative for itching and rash.  Neurological: Negative for dizziness, extremity weakness, headaches and numbness.  Hematological: Negative for adenopathy. Does not bruise/bleed easily.  Psychiatric/Behavioral: Negative for depression. The patient is not nervous/anxious.   Breast: Denies any new nodularity, masses, tenderness, nipple changes, or nipple discharge.       ONCOLOGY TREATMENT TEAM:  1. Surgeon:  Dr. Brantley Stage at [facility] 2. Medical Oncologist: Dr. Lindi Gutierrez  3. Radiation Oncologist: Dr. Lisbeth Renshaw    PAST MEDICAL/SURGICAL HISTORY:  Past Medical History:  Diagnosis Date  . Breast cancer ([place])   . Family history of breast cancer    Past Surgical History:  Procedure Laterality Date  . ADENOIDECTOMY  1957  . ADENOIDECTOMY     childhood  . MASTECTOMY W/ SENTINEL NODE BIOPSY Left 04/02/2020   Procedure: LEFT MASTECTOMY WITH LEFT TARGETED RADIOACTIVE SEED  LYMPH NODE BIOPSY AND LEFT SENTINEL LYMPH NODE MAPPING;  Surgeon: Erroll Luna, MD;  Location: [facility];  Service: General;  Laterality: Left;     ALLERGIES:  Allergies  Allergen Reactions  . Sulfa  Antibiotics     Unable to remember allergy     CURRENT MEDICATIONS:  Outpatient Encounter Medications as of 10/15/2020  Medication Sig  . acetaminophen (TYLENOL) 500 MG tablet  Take 500 mg by mouth every 6 (six) hours as needed.  . Ascorbic Acid 500 MG CHEW Chew by mouth.  Marland Kitchen aspirin EC 81 MG tablet Take 81 mg by mouth daily. Swallow whole.  . Cholecalciferol 1000 UNITS tablet Take 1,000 Units by mouth daily.  . dorzolamide-timolol (COSOPT) 22.3-6.8 MG/ML ophthalmic solution INT 1 GTT IN OU BID  . Melatonin 10 MG TABS Take by mouth.  . Multiple Minerals-Vitamins (CALCIUM-MAGNESIUM-ZINC-D3 PO) Take by mouth.  . Multiple Vitamins-Minerals (CENTRUM SILVER ADULT 50+ PO) Take by mouth.  . Multiple Vitamins-Minerals (OCUVITE EYE HEALTH FORMULA PO) Take by mouth daily.  . Multiple Vitamins-Minerals (ONE-A-DAY PROACTIVE 65+ PO) Take by mouth.  Marland Kitchen OVER THE COUNTER MEDICATION Focus Factor  . Pollen Extracts (PROSTAT PO) Take by mouth. prostagenix  . SUPER B COMPLEX/C PO Take by mouth.  . tamoxifen (NOLVADEX) 20 MG tablet Take 1 tablet (20 mg total) by mouth daily.   No facility-administered encounter medications on file as of 10/15/2020.     ONCOLOGIC FAMILY HISTORY:  Family History  Problem Relation Age of Onset  . Breast cancer Mother 54  . Heart disease Father 69       ?CAD     GENETIC COUNSELING/TESTING: Declined by patient.  SOCIAL HISTORY:  Social History   Socioeconomic History  . Marital status: Legally Separated    Spouse name: Not on file  . Number of children: Not on file  . Years of education: Not on file  . Highest education level: Not on file  Occupational History  . Not on file  Tobacco Use  . Smoking status: Never Smoker  . Smokeless tobacco: Never Used  Substance and Sexual Activity  . Alcohol use: No  . Drug use: No  . Sexual activity: Not Currently  Other Topics Concern  . Not on file  Social History Narrative  . Not on file   Social Determinants of Health   Financial Resource Strain: Not on file  Food Insecurity: Not on file  Transportation Needs: Not on file  Physical Activity: Not on file  Stress: Not on file  Social  Connections: Not on file  Intimate Partner Violence: Not on file     OBSERVATIONS/OBJECTIVE:  BP 135/66 (BP Location: Right Arm, Patient Position: Sitting)   Pulse 71   Temp 97.8 F (36.6 C) (Tympanic)   Resp 18   Ht _0  (1.676 m)   Wt 160 lb 3.2 oz (72.7 kg)   SpO2 100%   BMI 25.86 kg/m  GENERAL: Patient is a well appearing male in no acute distress HEENT:  Sclerae anicteric. Mask in place. Neck is supple.  NODES:  No cervical, supraclavicular, or axillary lymphadenopathy palpated.  BREAST EXAM:  Left breast s/p mastectomy and radiation, right breast with small nodule LUNGS:  Clear to auscultation bilaterally.  No wheezes or rhonchi. HEART:  Regular rate and rhythm. No murmur appreciated. ABDOMEN:  Soft, nontender.  Positive, normoactive bowel sounds. No organomegaly palpated. MSK:  No focal spinal tenderness to palpation. Full range of motion bilaterally in the upper extremities. EXTREMITIES:  No peripheral edema.   SKIN:  Clear with no obvious rashes or skin changes. No nail dyscrasia. NEURO:  Nonfocal. Well oriented.  Appropriate affect.    LABORATORY DATA:  None for this visit.  DIAGNOSTIC  IMAGING:  None for this visit.      ASSESSMENT AND PLAN:  Danny Gutierrez is a pleasant 75 y.o. male with Stage IB left breast invasive ductal carcinoma, ER+/PR+/HER2-, diagnosed in 9/20214, treated with lumpectomy, adjuvant radiation therapy, and anti-estrogen therapy with Tamoxifen beginning in 07/2019.  She presents to the Survivorship Clinic for our initial meeting and routine follow-up post-completion of treatment for breast cancer.    1. Stage IB left breast cancer:  Danny Gutierrez is continuing to recover from definitive treatment for breast cancer. he will follow-up with her medical oncologist, Dr. Lindi Gutierrez in 6 months with history and physical exam per surveillance protocol.Marland Kitchen  He will continue tamoxifen daily and is tolerating it well.   Today, a comprehensive  survivorship care plan and treatment summary was reviewed with the patient today detailing his breast cancer diagnosis, treatment course, potential late/long-term effects of treatment, appropriate follow-up care with recommendations for the future, and patient education resources.  A copy of this summary, along with a letter will be sent to the patient's primary care provider via mail/fax/In Basket message after today's visit.    2. Lung nodules:  These are likely benign, but needs repeat CT chest in 02/2021.  3. Breast nodule:  I placed an order for right breast diagnostic mammogram and ultrasound to fully evaluate.  4. Cancer screening:  Due to Danny Gutierrez's history and his age, he should receive screening for skin cancers, colon cancer, and prostate cancers.  The information and recommendations are listed on the patient's comprehensive care plan/treatment summary and were reviewed in detail with the patient.    5. Health maintenance and wellness promotion: Danny Gutierrez was encouraged to consume 5-7 servings of fruits and vegetables per day. We reviewed the "Nutrition Rainbow" handout, as well as the handout "Take Control of Your Health and Reduce Your Cancer Risk" .  he was also encouraged to engage in moderate to vigorous exercise for 30 minutes per day most days of the week. We discussed the [employer] fitness program, which is designed for cancer survivors to help them become more physically fit after cancer treatments.  he was instructed to limit her alcohol consumption and continue to abstain from tobacco use.     6. Support services/counseling: It is not uncommon for this period of the patient's cancer care trajectory to be one of many emotions and stressors.  We discussed how this can be increasingly difficult during the times of quarantine and social distancing due to the COVID-19 pandemic.   he was given information regarding our available services and encouraged to contact me  with any questions or for help enrolling in any of our support group/programs.    Follow up instructions:    -Return to cancer center in 6 months for f/u with Dr. Lindi Gutierrez  -Mammogram due ASAP -Follow up with surgery in 1 year -She is welcome to return back to the Survivorship Clinic at any time; no additional follow-up needed at this time.  -Consider referral back to survivorship as a long-term survivor for continued surveillance  The patient was provided an opportunity to ask questions and all were answered. The patient agreed with the plan and demonstrated an understanding of the instructions.   Total encounter time: 51 minutes* in reviewing the above with the patient, ordering imaging test, and communication with oncologist, surgery, and patient PCP.  Wilber Bihari, NP 10/15/20 9:45 AM Medical Oncology and Hematology [place],  [address] Tel. [phone]  Fax. [phone]  *Total Encounter Time as defined by the Centers for Medicare and Medicaid Services includes, in addition to the face-to-face time of a patient visit (documented in the note above) non-face-to-face time: obtaining and reviewing outside history, ordering and reviewing medications, tests or procedures, care coordination (communications with other health care professionals or caregivers) and documentation in the medical record.

## 2020-10-19 ENCOUNTER — Telehealth: Payer: Self-pay | Admitting: Hematology and Oncology

## 2020-10-19 NOTE — Telephone Encounter (Signed)
Scheduled per 4/28 los. Mailing appt calendar and appt letter

## 2020-10-26 ENCOUNTER — Other Ambulatory Visit: Payer: Self-pay

## 2020-10-26 ENCOUNTER — Telehealth: Payer: Self-pay | Admitting: Hematology and Oncology

## 2020-10-26 ENCOUNTER — Ambulatory Visit: Payer: PPO | Attending: Surgery

## 2020-10-26 DIAGNOSIS — Z483 Aftercare following surgery for neoplasm: Secondary | ICD-10-CM

## 2020-10-26 NOTE — Therapy (Signed)
[place], [place], [address] Phone: [phone]   Fax:  [phone]  Physical Therapy Treatment  Patient Details  Name: Danny Gutierrez MRN: [id number] Date of Birth: [birth year] Referring Provider (PT): Dr. Erroll Luna   Encounter Date: 10/26/2020   PT End of Session - 10/26/20 1051    Visit Number 2   # unchanged due to screen only   Number of Visits 2    PT Start Time 1047    PT Stop Time 1055    PT Time Calculation (min) 8 min    Activity Tolerance Patient tolerated treatment well    Behavior During Therapy [facility] for tasks assessed/performed           Past Medical History:  Diagnosis Date  . Breast cancer ([place])   . Family history of breast cancer     Past Surgical History:  Procedure Laterality Date  . ADENOIDECTOMY  1957  . ADENOIDECTOMY     childhood  . MASTECTOMY W/ SENTINEL NODE BIOPSY Left 04/02/2020   Procedure: LEFT MASTECTOMY WITH LEFT TARGETED RADIOACTIVE SEED  LYMPH NODE BIOPSY AND LEFT SENTINEL LYMPH NODE MAPPING;  Surgeon: Erroll Luna, MD;  Location: [facility];  Service: General;  Laterality: Left;    There were no vitals filed for this visit.   Subjective Assessment - 10/26/20 1049    Subjective Pt returns for 3 month L-Dex screen.    Pertinent History Patient was diagnosed on 03/04/2020 with left grade II invasive ductal carcinoma with DCIS breast cancer. It measures 2.3 cm and is located in the upper inner quadrant. It is ER/PR positive and HER2 negative with a Ki67 of 25%. He has a positive axillary lymph node.on 07/04/2019 he had left mastectomy with 1/2 left axillary lymph nodes positive for carcinoma.  He will not have to have chemotherapy but will have radiation.  Past histroy includes glaucoma                  L-DEX FLOWSHEETS - 10/26/20 1000      L-DEX LYMPHEDEMA SCREENING   Measurement Type Unilateral    L-DEX MEASUREMENT  EXTREMITY Upper Extremity    POSITION  Standing    DOMINANT SIDE Right    At Risk Side Left    BASELINE SCORE (UNILATERAL) 3.3    L-DEX SCORE (UNILATERAL) 5.4    VALUE CHANGE (UNILAT) 2.1                                  PT Long Term Goals - 04/22/20 1350      PT LONG TERM GOAL #1   Title Patient will demonstrate he has regained full shoulder ROM and function post operatively compared to baselines.    Baseline Pt lacks a few degress from baselin but had adeqeuate ROM for radiation    Period Weeks    Status Partially Met                 Plan - 10/26/20 1055    Clinical Impression Statement Pt returns for 3 month L-Dex screen. His change from baseline of 2.1 is WNLs so no further treatment is required at this time except to cont every 3 month L-Dex screens which pt is agreeable to.    PT Next Visit Plan Cont every 3 month L-Dex screens for up to 2 years from his SLNB surgery.    Consulted  and Agree with Plan of Care Patient           Patient will benefit from skilled therapeutic intervention in order to improve the following deficits and impairments:     Visit Diagnosis: Aftercare following surgery for neoplasm     Problem List Patient Active Problem List   Diagnosis Date Noted  . Family history of breast cancer   . Malignant neoplasm of upper-inner quadrant of left breast in male, estrogen receptor positive ([place]) 03/16/2020  . Gynecomastia 02/06/2020  . Elevated BP without diagnosis of hypertension 02/05/2019  . Lip lesion 01/25/2018  . Glaucoma 01/24/2017  . Cough 01/13/2016  . Palpitations 01/12/2015  . Prostate nodule 01/12/2015  . Benign cystic mucinous tumor 01/08/2013  . Sebaceous cyst 01/24/2012  . Well adult exam 03/28/2011    [name], PTA 10/26/2020, 10:59 AM  [place], [place], [address] Phone: [phone]   Fax:   [phone]  Name: Danny Gutierrez MRN: [id number] Date of Birth: Mar 12, 1946

## 2020-10-26 NOTE — Telephone Encounter (Signed)
R/s appt per 5/9 sch msg. Pt aware.  

## 2020-11-23 ENCOUNTER — Ambulatory Visit
Admission: RE | Admit: 2020-11-23 | Discharge: 2020-11-23 | Disposition: A | Discharge status: Home / Self Care | Payer: PPO | Source: Ambulatory Visit | Attending: Adult Health | Admitting: Adult Health

## 2020-11-23 ENCOUNTER — Ambulatory Visit: Payer: PPO

## 2020-11-23 DIAGNOSIS — R928 Other abnormal and inconclusive findings on diagnostic imaging of breast: Secondary | ICD-10-CM | POA: Diagnosis not present

## 2020-11-23 DIAGNOSIS — C50222 Malignant neoplasm of upper-inner quadrant of left male breast: Secondary | ICD-10-CM

## 2020-11-23 DIAGNOSIS — Z170 Estrogen receptor positive status [ER+]: Secondary | ICD-10-CM

## 2020-11-23 DIAGNOSIS — Z853 Personal history of malignant neoplasm of breast: Secondary | ICD-10-CM | POA: Diagnosis not present

## 2020-11-23 DIAGNOSIS — R918 Other nonspecific abnormal finding of lung field: Secondary | ICD-10-CM

## 2020-11-30 DIAGNOSIS — C50922 Malignant neoplasm of unspecified site of left male breast: Secondary | ICD-10-CM | POA: Diagnosis not present

## 2020-11-30 DIAGNOSIS — N62 Hypertrophy of breast: Secondary | ICD-10-CM | POA: Diagnosis not present

## 2021-01-25 ENCOUNTER — Ambulatory Visit: Payer: PPO | Attending: Surgery

## 2021-01-25 ENCOUNTER — Other Ambulatory Visit: Payer: Self-pay

## 2021-01-25 VITALS — Wt 155.4 lb

## 2021-01-25 DIAGNOSIS — Z483 Aftercare following surgery for neoplasm: Secondary | ICD-10-CM | POA: Insufficient documentation

## 2021-01-25 NOTE — Therapy (Signed)
[place], [place], [address] Phone: [phone]   Fax:  [phone]  Physical Therapy Treatment  Patient Details  Name: Danny Gutierrez MRN: [id number] Date of Birth: May 22, 1946 Referring Provider (PT): Dr. Erroll Luna   Encounter Date: 01/25/2021   PT End of Session - 01/25/21 1048     Visit Number 2   # unchanged due to screen only   PT Start Time 5885    PT Stop Time 1049    PT Time Calculation (min) 6 min    Activity Tolerance Patient tolerated treatment well    Behavior During Therapy [facility] for tasks assessed/performed             Past Medical History:  Diagnosis Date   Breast cancer ([place])    Family history of breast cancer     Past Surgical History:  Procedure Laterality Date   ADENOIDECTOMY  1957   ADENOIDECTOMY     childhood   MASTECTOMY W/ SENTINEL NODE BIOPSY Left 04/02/2020   Procedure: LEFT MASTECTOMY WITH LEFT TARGETED RADIOACTIVE SEED  LYMPH NODE BIOPSY AND LEFT SENTINEL LYMPH NODE MAPPING;  Surgeon: Erroll Luna, MD;  Location: [place] [facility];  Service: General;  Laterality: Left;    Vitals:   01/25/21 1043  Weight: 155 lb 6 oz (70.5 kg)     Subjective Assessment - 01/25/21 1044     Subjective Pt returns for 3 month L-Dex screen.    Pertinent History Patient was diagnosed on 03/04/2020 with left grade II invasive ductal carcinoma with DCIS breast cancer. It measures 2.3 cm and is located in the upper inner quadrant. It is ER/PR positive and HER2 negative with a Ki67 of 25%. He has a positive axillary lymph node.on 07/04/2019 he had left mastectomy with 1/2 left axillary lymph nodes positive for carcinoma.  He will not have to have chemotherapy but will have radiation.  Past histroy includes glaucoma                    L-DEX FLOWSHEETS - 01/25/21 1000       L-DEX LYMPHEDEMA SCREENING   Measurement Type Unilateral    L-DEX MEASUREMENT  EXTREMITY Upper Extremity    POSITION  Standing    DOMINANT SIDE Right    At Risk Side Left    BASELINE SCORE (UNILATERAL) 3.3    L-DEX SCORE (UNILATERAL) 5    VALUE CHANGE (UNILAT) 1.7                                    PT Long Term Goals - 04/22/20 1350       PT LONG TERM GOAL #1   Title Patient will demonstrate he has regained full shoulder ROM and function post operatively compared to baselines.    Baseline Pt lacks a few degress from baselin but had adeqeuate ROM for radiation    Period Weeks    Status Partially Met                   Plan - 01/25/21 1049     Clinical Impression Statement Pt returns for his 3 month L-Dex screen. His change from baseline of 1.7 is WNLs so no further treatment is required at this time except to cont every 3 month L-Dex screens which pt is agreeable to.    PT Next Visit Plan Cont every 3 month L-Dex  screens for up to 2 years from his SLNB surgery.    Consulted and Agree with Plan of Care Patient             Patient will benefit from skilled therapeutic intervention in order to improve the following deficits and impairments:     Visit Diagnosis: Aftercare following surgery for neoplasm     Problem List Patient Active Problem List   Diagnosis Date Noted   Family history of breast cancer    Malignant neoplasm of upper-inner quadrant of left breast in male, estrogen receptor positive ([place]) 03/16/2020   Gynecomastia 02/06/2020   Elevated BP without diagnosis of hypertension 02/05/2019   Lip lesion 01/25/2018   Glaucoma 01/24/2017   Cough 01/13/2016   Palpitations 01/12/2015   Prostate nodule 01/12/2015   Benign cystic mucinous tumor 01/08/2013   Sebaceous cyst 01/24/2012   Well adult exam 03/28/2011    [name], PTA 01/25/2021, 10:50 AM  [place], [place], [address] Phone: [phone]   Fax:   [phone]  Name: Danny Gutierrez MRN: [id number] Date of Birth: 1946-05-22

## 2021-02-10 ENCOUNTER — Encounter: Payer: Self-pay | Admitting: Internal Medicine

## 2021-02-10 ENCOUNTER — Ambulatory Visit (INDEPENDENT_AMBULATORY_CARE_PROVIDER_SITE_OTHER): Payer: PPO | Admitting: Internal Medicine

## 2021-02-10 ENCOUNTER — Other Ambulatory Visit: Payer: Self-pay

## 2021-02-10 VITALS — BP 128/72 | HR 72 | Temp 98.6°F | Ht 66.0 in | Wt 159.8 lb

## 2021-02-10 DIAGNOSIS — R0789 Other chest pain: Secondary | ICD-10-CM | POA: Diagnosis not present

## 2021-02-10 DIAGNOSIS — Z0001 Encounter for general adult medical examination with abnormal findings: Secondary | ICD-10-CM

## 2021-02-10 DIAGNOSIS — Z0000 Encounter for general adult medical examination without abnormal findings: Secondary | ICD-10-CM

## 2021-02-10 DIAGNOSIS — C50222 Malignant neoplasm of upper-inner quadrant of left male breast: Secondary | ICD-10-CM | POA: Diagnosis not present

## 2021-02-10 DIAGNOSIS — R918 Other nonspecific abnormal finding of lung field: Secondary | ICD-10-CM | POA: Diagnosis not present

## 2021-02-10 DIAGNOSIS — Z170 Estrogen receptor positive status [ER+]: Secondary | ICD-10-CM | POA: Diagnosis not present

## 2021-02-10 DIAGNOSIS — E785 Hyperlipidemia, unspecified: Secondary | ICD-10-CM

## 2021-02-10 LAB — COMPREHENSIVE METABOLIC PANEL
ALT: 21 U/L (ref 0–53)
AST: 18 U/L (ref 0–37)
Albumin: 3.8 g/dL (ref 3.5–5.2)
Alkaline Phosphatase: 34 U/L — ABNORMAL LOW (ref 39–117)
BUN: 18 mg/dL (ref 6–23)
CO2: 27 mEq/L (ref 19–32)
Calcium: 9 mg/dL (ref 8.4–10.5)
Chloride: 104 mEq/L (ref 96–112)
Creatinine, Ser: 0.77 mg/dL (ref 0.40–1.50)
GFR: 88.78 mL/min (ref >60.00)
Glucose, Bld: 99 mg/dL (ref 70–99)
Potassium: 3.9 mEq/L (ref 3.5–5.1)
Sodium: 138 mEq/L (ref 135–145)
Total Bilirubin: 0.6 mg/dL (ref 0.2–1.2)
Total Protein: 6.8 g/dL (ref 6.0–8.3)

## 2021-02-10 LAB — URINALYSIS
Bilirubin Urine: NEGATIVE
Hgb urine dipstick: NEGATIVE
Leukocytes,Ua: NEGATIVE
Nitrite: NEGATIVE
Specific Gravity, Urine: >=1.03 — AB (ref 1.000–1.030)
Total Protein, Urine: NEGATIVE
Urine Glucose: NEGATIVE
Urobilinogen, UA: 0.2 (ref 0.0–1.0)
pH: 5.5 (ref 5.0–8.0)

## 2021-02-10 LAB — CBC WITH DIFFERENTIAL/PLATELET
Basophils Absolute: 0 10*3/uL (ref 0.0–0.1)
Basophils Relative: 0.7 % (ref 0.0–3.0)
Eosinophils Absolute: 0.2 10*3/uL (ref 0.0–0.7)
Eosinophils Relative: 2.6 % (ref 0.0–5.0)
HCT: 44.3 % (ref 39.0–52.0)
Hemoglobin: 14.3 g/dL (ref 13.0–17.0)
Lymphocytes Relative: 15.6 % (ref 12.0–46.0)
Lymphs Abs: 0.9 10*3/uL (ref 0.7–4.0)
MCHC: 32.4 g/dL (ref 30.0–36.0)
MCV: 85 fl (ref 78.0–100.0)
Monocytes Absolute: 0.5 10*3/uL (ref 0.1–1.0)
Monocytes Relative: 8.5 % (ref 3.0–12.0)
Neutro Abs: 4.4 10*3/uL (ref 1.4–7.7)
Neutrophils Relative %: 72.6 % (ref 43.0–77.0)
Platelets: 190 10*3/uL (ref 150.0–400.0)
RBC: 5.21 Mil/uL (ref 4.22–5.81)
RDW: 14.9 % (ref 11.5–15.5)
WBC: 6.1 10*3/uL (ref 4.0–10.5)

## 2021-02-10 LAB — LIPID PANEL
Cholesterol: 205 mg/dL — ABNORMAL HIGH (ref 0–200)
HDL: 39.1 mg/dL (ref >39.00)
LDL Cholesterol: 143 mg/dL — ABNORMAL HIGH (ref 0–99)
NonHDL: 166.03
Total CHOL/HDL Ratio: 5
Triglycerides: 116 mg/dL (ref 0.0–149.0)
VLDL: 23.2 mg/dL (ref 0.0–40.0)

## 2021-02-10 LAB — TSH: TSH: 3.93 u[IU]/mL (ref 0.35–5.50)

## 2021-02-10 LAB — SEDIMENTATION RATE: Sed Rate: 9 mm/hr (ref 0–20)

## 2021-02-10 NOTE — Assessment & Plan Note (Signed)
On CT 03/2020. May need a repeat CT in 03/2021. He will see Dr Lindi Adie first

## 2021-02-10 NOTE — Addendum Note (Signed)
Addended by: Trenda Moots on: [id number] 11:11 AM   Modules accepted: Orders

## 2021-02-10 NOTE — Progress Notes (Signed)
Subjective:  Patient ID: Danny Gutierrez, male    DOB: [birth year]  Age: 75 y.o. MRN: [id number]  CC: Annual Exam   HPI Danny Gutierrez presents for a well exam  S/p L mastectomy 04/02/20 XRT x33 Tamoxifen since 08/14/20  F/u pulmonary nodules  Outpatient Medications Prior to Visit  Medication Sig Dispense Refill   acetaminophen (TYLENOL) 500 MG tablet Take 500 mg by mouth every 6 (six) hours as needed.     Ascorbic Acid 500 MG CHEW Chew by mouth.     aspirin EC 81 MG tablet Take 81 mg by mouth daily. Swallow whole.     Cholecalciferol 1000 UNITS tablet Take 1,000 Units by mouth daily.     dorzolamide-timolol (COSOPT) 22.3-6.8 MG/ML ophthalmic solution INT 1 GTT IN OU BID  6   LUMIGAN 0.01 % SOLN 1 drop at bedtime. 1 drop in each eye at bedtime     Melatonin 10 MG TABS Take by mouth.     Multiple Minerals-Vitamins (CALCIUM-MAGNESIUM-ZINC-D3 PO) Take by mouth.     Multiple Vitamins-Minerals (CENTRUM SILVER ADULT 50+ PO) Take by mouth.     Multiple Vitamins-Minerals (OCUVITE EYE HEALTH FORMULA PO) Take by mouth daily.     Multiple Vitamins-Minerals (ONE-A-DAY PROACTIVE 65+ PO) Take by mouth.     OVER THE COUNTER MEDICATION Focus Factor     Pollen Extracts (PROSTAT PO) Take by mouth. prostagenix     SUPER B COMPLEX/C PO Take by mouth.     tamoxifen (NOLVADEX) 20 MG tablet Take 1 tablet (20 mg total) by mouth daily. 90 tablet 3   No facility-administered medications prior to visit.    ROS: Review of Systems  Constitutional:  Negative for appetite change, fatigue and unexpected weight change.  HENT:  Negative for congestion, nosebleeds, sneezing, sore throat and trouble swallowing.   Eyes:  Negative for itching and visual disturbance.  Respiratory:  Negative for cough.   Cardiovascular:  Negative for chest pain, palpitations and leg swelling.  Gastrointestinal:  Negative for abdominal distention, blood in stool, diarrhea and nausea.  Genitourinary:  Negative for  frequency and hematuria.  Musculoskeletal:  Negative for back pain, gait problem, joint swelling and neck pain.  Skin:  Negative for rash.  Neurological:  Negative for dizziness, tremors, speech difficulty and weakness.  Psychiatric/Behavioral:  Negative for agitation, dysphoric mood, sleep disturbance and suicidal ideas. The patient is not nervous/anxious.    Objective:  BP 128/72 (BP Location: Left Arm)   Pulse 72   Temp 98.6 F (37 C) (Oral)   Ht '5\' 6"'$  (1.676 m)   Wt 159 lb 12.8 oz (72.5 kg)   SpO2 96%   BMI 25.79 kg/m   BP Readings from Last 3 Encounters:  02/10/21 128/72  10/15/20 135/66  06/10/20 132/84    Wt Readings from Last 3 Encounters:  02/10/21 159 lb 12.8 oz (72.5 kg)  01/25/21 155 lb 6 oz (70.5 kg)  10/15/20 160 lb 3.2 oz (72.7 kg)    Physical Exam Constitutional:      General: He is not in acute distress.    Appearance: He is well-developed. He is obese.     Comments: NAD  Eyes:     Conjunctiva/sclera: Conjunctivae normal.     Pupils: Pupils are equal, round, and reactive to light.  Neck:     Thyroid: No thyromegaly.     Vascular: No JVD.  Cardiovascular:     Rate and Rhythm: Normal rate and regular rhythm.     Heart  sounds: Normal heart sounds. No murmur heard.   No friction rub. No gallop.  Pulmonary:     Effort: Pulmonary effort is normal. No respiratory distress.     Breath sounds: Normal breath sounds. No wheezing or rales.  Chest:     Chest wall: No tenderness.  Abdominal:     General: Bowel sounds are normal. There is no distension.     Palpations: Abdomen is soft. There is no mass.     Tenderness: There is no abdominal tenderness. There is no guarding or rebound.  Musculoskeletal:        General: No tenderness. Normal range of motion.     Cervical back: Normal range of motion.  Lymphadenopathy:     Cervical: No cervical adenopathy.  Skin:    General: Skin is warm and dry.     Findings: No rash.  Neurological:     Mental Status: He  is alert and oriented to person, place, and time.     Cranial Nerves: No cranial nerve deficit.     Motor: No abnormal muscle tone.     Coordination: Coordination normal.     Gait: Gait normal.     Deep Tendon Reflexes: Reflexes are normal and symmetric.  Psychiatric:        Behavior: Behavior normal.        Thought Content: Thought content normal.        Judgment: Judgment normal.   Mastectomy scar L Rectal - pt declined  Procedure: EKG Indication: CP post-op Impression: NSR -S brady. No acute changes.   Lab Results  Component Value Date   WBC 9.0 03/18/2020   HGB 15.1 03/18/2020   HCT 45.0 03/18/2020   PLT 199 03/18/2020   GLUCOSE 122 (H) 03/18/2020   CHOL 223 (H) 02/06/2020   TRIG 140 02/06/2020   HDL 43 02/06/2020   LDLDIRECT 165.0 02/05/2019   LDLCALC 154 (H) 02/06/2020   ALT 21 03/18/2020   AST 16 03/18/2020   NA 139 03/18/2020   K 4.0 03/18/2020   CL 105 03/18/2020   CREATININE 0.78 03/18/2020   BUN 12 03/18/2020   CO2 29 03/18/2020   TSH 2.86 02/06/2020   PSA 2.2 02/06/2020    MM DIAG BREAST TOMO UNI RIGHT  Result Date: 11/23/2020 CLINICAL DATA:  Patient referring clinician reports a right breast lump. The patient does not feel this and is not certain where his clinician felt this. Patient has a history of a left lumpectomy for breast carcinoma, diagnosed on February 19, 2020. Patient is currently on tamoxifen therapy. EXAM: DIGITAL DIAGNOSTIC UNILATERAL RIGHT MAMMOGRAM WITH TOMOSYNTHESIS AND CAD TECHNIQUE: Right digital diagnostic mammography and breast tomosynthesis was performed. The images were evaluated with computer-aided detection. COMPARISON:  03/04/2020 ACR Breast Density Category a: The breast tissue is almost entirely fatty. FINDINGS: There are no masses, areas of architectural distortion or suspicious calcifications. No mammographic change. IMPRESSION: No evidence of right breast malignancy. RECOMMENDATION: 1. Clinical follow-up for the reported  palpable abnormality. 2. Consider annual screening right breast mammography given the patient's personal history of left breast carcinoma. I have discussed the findings and recommendations with the patient. If applicable, a reminder letter will be sent to the patient regarding the next appointment. BI-RADS CATEGORY  1: Negative. Electronically Signed   By: Lajean Manes M.D.   On: 11/23/2020 13:50    Assessment & Plan:    Follow-up: No follow-ups on file.  Walker Kehr, MD

## 2021-02-10 NOTE — Assessment & Plan Note (Signed)
S/p L mastectomy 04/02/20 XRT x33 Tamoxifen since 08/14/20

## 2021-02-14 ENCOUNTER — Other Ambulatory Visit: Payer: Self-pay | Admitting: Internal Medicine

## 2021-02-14 DIAGNOSIS — Z0000 Encounter for general adult medical examination without abnormal findings: Secondary | ICD-10-CM

## 2021-02-14 DIAGNOSIS — N402 Nodular prostate without lower urinary tract symptoms: Secondary | ICD-10-CM

## 2021-02-15 ENCOUNTER — Other Ambulatory Visit (INDEPENDENT_AMBULATORY_CARE_PROVIDER_SITE_OTHER): Payer: PPO

## 2021-02-15 DIAGNOSIS — Z0000 Encounter for general adult medical examination without abnormal findings: Secondary | ICD-10-CM

## 2021-02-15 DIAGNOSIS — N402 Nodular prostate without lower urinary tract symptoms: Secondary | ICD-10-CM | POA: Diagnosis not present

## 2021-02-15 LAB — PSA: PSA: 1.95 ng/mL (ref 0.10–4.00)

## 2021-02-22 ENCOUNTER — Encounter: Payer: Self-pay | Admitting: Hematology and Oncology

## 2021-03-01 ENCOUNTER — Other Ambulatory Visit: Payer: Self-pay

## 2021-03-01 ENCOUNTER — Encounter: Payer: Self-pay | Admitting: Internal Medicine

## 2021-03-01 ENCOUNTER — Ambulatory Visit (INDEPENDENT_AMBULATORY_CARE_PROVIDER_SITE_OTHER): Payer: PPO | Admitting: Internal Medicine

## 2021-03-01 DIAGNOSIS — R42 Dizziness and giddiness: Secondary | ICD-10-CM

## 2021-03-01 MED ORDER — DIPHENHYDRAMINE HCL 12.5 MG/5ML PO ELIX: 6.2500 mg | ORAL_SOLUTION | Freq: Four times a day (QID) | ORAL | 0 refills | Status: AC | PRN

## 2021-03-01 NOTE — Progress Notes (Signed)
Subjective:  Patient ID: Danny Gutierrez, male    DOB: Jul 12, 1945  Age: 75 y.o. MRN: [id number]  CC: Dizziness (Pt states he had some dizziness week ago on 02/20/21. He states its been off and on mostly at night)   HPI Danny Gutierrez presents for vertigo on 02/20/21 x 1 day  Outpatient Medications Prior to Visit  Medication Sig Dispense Refill   acetaminophen (TYLENOL) 500 MG tablet Take 500 mg by mouth every 6 (six) hours as needed.     Ascorbic Acid 500 MG CHEW Chew by mouth.     aspirin EC 81 MG tablet Take 81 mg by mouth daily. Swallow whole.     Cholecalciferol 1000 UNITS tablet Take 1,000 Units by mouth daily.     dorzolamide-timolol (COSOPT) 22.3-6.8 MG/ML ophthalmic solution INT 1 GTT IN OU BID  6   LUMIGAN 0.01 % SOLN 1 drop at bedtime. 1 drop in each eye at bedtime     Melatonin 10 MG TABS Take by mouth.     Multiple Minerals-Vitamins (CALCIUM-MAGNESIUM-ZINC-D3 PO) Take by mouth.     Multiple Vitamins-Minerals (CENTRUM SILVER ADULT 50+ PO) Take by mouth.     Multiple Vitamins-Minerals (OCUVITE EYE HEALTH FORMULA PO) Take by mouth daily.     Multiple Vitamins-Minerals (ONE-A-DAY PROACTIVE 65+ PO) Take by mouth.     OVER THE COUNTER MEDICATION Focus Factor     Pollen Extracts (PROSTAT PO) Take by mouth. prostagenix     SUPER B COMPLEX/C PO Take by mouth.     tamoxifen (NOLVADEX) 20 MG tablet Take 1 tablet (20 mg total) by mouth daily. 90 tablet 3   No facility-administered medications prior to visit.    ROS: Review of Systems  Constitutional:  Negative for appetite change, fatigue and unexpected weight change.  HENT:  Negative for congestion, nosebleeds, sneezing, sore throat and trouble swallowing.   Eyes:  Negative for itching and visual disturbance.  Respiratory:  Negative for cough.   Cardiovascular:  Negative for chest pain, palpitations and leg swelling.  Gastrointestinal:  Negative for abdominal distention, blood in stool, diarrhea and nausea.   Genitourinary:  Negative for frequency and hematuria.  Musculoskeletal:  Negative for back pain, gait problem, joint swelling and neck pain.  Skin:  Negative for rash.  Neurological:  Positive for dizziness. Negative for tremors, seizures, speech difficulty and weakness.  Psychiatric/Behavioral:  Negative for agitation, dysphoric mood and sleep disturbance. The patient is not nervous/anxious.    Objective:  BP 110/78 (BP Location: Left Arm)   Pulse 65   Temp 98.5 F (36.9 C) (Oral)   Ht '5\' 6"'$  (1.676 m)   Wt 158 lb 12.8 oz (72 kg)   SpO2 94%   BMI 25.63 kg/m   BP Readings from Last 3 Encounters:  03/01/21 110/78  02/10/21 128/72  10/15/20 135/66    Wt Readings from Last 3 Encounters:  03/01/21 158 lb 12.8 oz (72 kg)  02/10/21 159 lb 12.8 oz (72.5 kg)  01/25/21 155 lb 6 oz (70.5 kg)    Physical Exam Constitutional:      General: He is not in acute distress.    Appearance: He is well-developed.     Comments: NAD  Eyes:     Conjunctiva/sclera: Conjunctivae normal.     Pupils: Pupils are equal, round, and reactive to light.  Neck:     Thyroid: No thyromegaly.     Vascular: No JVD.  Cardiovascular:     Rate and Rhythm: Normal rate and regular  rhythm.     Heart sounds: Normal heart sounds. No murmur heard.   No friction rub. No gallop.  Pulmonary:     Effort: Pulmonary effort is normal. No respiratory distress.     Breath sounds: Normal breath sounds. No wheezing or rales.  Chest:     Chest wall: No tenderness.  Abdominal:     General: Bowel sounds are normal. There is no distension.     Palpations: Abdomen is soft. There is no mass.     Tenderness: There is no abdominal tenderness. There is no guarding or rebound.  Musculoskeletal:        General: No tenderness. Normal range of motion.     Cervical back: Normal range of motion.  Lymphadenopathy:     Cervical: No cervical adenopathy.  Skin:    General: Skin is warm and dry.     Findings: No rash.  Neurological:      Mental Status: He is alert and oriented to person, place, and time.     Cranial Nerves: No cranial nerve deficit.     Motor: No abnormal muscle tone.     Coordination: Coordination normal.     Gait: Gait normal.     Deep Tendon Reflexes: Reflexes are normal and symmetric.  Psychiatric:        Behavior: Behavior normal.        Thought Content: Thought content normal.        Judgment: Judgment normal.  Hallpike is positive on the left Your exam is nonfocal Romberg negative  Lab Results  Component Value Date   WBC 6.1 02/10/2021   HGB 14.3 02/10/2021   HCT 44.3 02/10/2021   PLT 190.0 02/10/2021   GLUCOSE 99 02/10/2021   CHOL 205 (H) 02/10/2021   TRIG 116.0 02/10/2021   HDL 39.10 02/10/2021   LDLDIRECT 165.0 02/05/2019   LDLCALC 143 (H) 02/10/2021   ALT 21 02/10/2021   AST 18 02/10/2021   NA 138 02/10/2021   K 3.9 02/10/2021   CL 104 02/10/2021   CREATININE 0.77 02/10/2021   BUN 18 02/10/2021   CO2 27 02/10/2021   TSH 3.93 02/10/2021   PSA 1.95 02/15/2021    MM DIAG BREAST TOMO UNI RIGHT  Result Date: 11/23/2020 CLINICAL DATA:  Patient referring clinician reports a right breast lump. The patient does not feel this and is not certain where his clinician felt this. Patient has a history of a left lumpectomy for breast carcinoma, diagnosed on February 19, 2020. Patient is currently on tamoxifen therapy. EXAM: DIGITAL DIAGNOSTIC UNILATERAL RIGHT MAMMOGRAM WITH TOMOSYNTHESIS AND CAD TECHNIQUE: Right digital diagnostic mammography and breast tomosynthesis was performed. The images were evaluated with computer-aided detection. COMPARISON:  03/04/2020 ACR Breast Density Category a: The breast tissue is almost entirely fatty. FINDINGS: There are no masses, areas of architectural distortion or suspicious calcifications. No mammographic change. IMPRESSION: No evidence of right breast malignancy. RECOMMENDATION: 1. Clinical follow-up for the reported palpable abnormality. 2. Consider  annual screening right breast mammography given the patient's personal history of left breast carcinoma. I have discussed the findings and recommendations with the patient. If applicable, a reminder letter will be sent to the patient regarding the next appointment. BI-RADS CATEGORY  1: Negative. Electronically Signed   By: Lajean Manes M.D.   On: 11/23/2020 13:50    Assessment & Plan:   There are no diagnoses linked to this encounter.   No orders of the defined types were placed in this encounter.  Follow-up: No follow-ups on file.  Walker Kehr, MD

## 2021-03-01 NOTE — Patient Instructions (Signed)
Benign Positional Vertigo symptoms on the left. Start Meclizine if needed??????? Vertigo ??????? -- ??? ???????? ????, ??? ?? ????????? ??? ???????? ?????? ??? ?????????, ???? ?? ????? ???? ??? ???????? ?? ?????. ??? ???????? ????? ????????? ? ???????? ? ????? ?????. ??????? ????? ???????? ??????????????. ??? ????????? ????? ???????????? ?????????, ???? ?? ??????????? ?????? ?????? ????????????, ??? ???????? ??? ?????? ? ???????????. ??? ???? ??????? ????????????, ????? ?????????? ??????? ???????. ??? ???????????? ????? ??????? ?????? ????? ??????? ??????? ? ????????? ??????? ??????? ??? ???. ? ???????? ???????? ???????? ????? ????????????: ??? ? ???????   ????? ?????????? ????????, ????? ???? ????? (????) ?????????? ??????-??????? ?????. ?? ????? ????????. ???????????? ????????????? ? ????? ???????????? ?????, ????? ??? ???? ??????, ??? ??? ?????????. ????? ??????? ???????? ?? ???? ???????. ????? ?? ?????????? ???? ??????, ???????? ????????, ??????? ?? ???-????, ???? ?? ?????????, ??? ????????? ??????? ??????????. ?????????? ????????. ????????? ?????? ???????? ???? ??? ?????? ??? ???????????? ????????? ????, ??? ??????? ????? ??????. ???? ??? ?????? ?????? ??? ??????, ?????????????? ???????. ???? ?? ?????????? ??????????????, ?????????? ??????. ????????? ?????????? ?????-???? ??? ??? ????????, ??????? ????? ???????????? ????????? ??? ??? ??? ?????? ???, ???? ?? ???????????? ??????????????. ???? ?? ?????????? ??????????????, ????????? ????????. ??????????? ???????? ? ????? ???? ???, ????? ?? ????? ????? ?? ?????, ?? ???????? ? ?? ??????????. ?? ?????????? ??????????? ? ?? ????????? ? ???????????? ?????????????, ???? ? ??? ???????? ??????. ????? ???????? ?????????? ?????????????? ? ??????????? ????????????? ????????? ?????? ??? ??????? ??????. ????????? ?? ??? ?????? ???????????? ??????????. ?????????? ? ?????, ????: ???? ????????? ?? ???????? ?????????? ? ???????. ???? ????????? ??????????  ????, ??? ? ??? ????????? ????? ????????. ? ??? ?????????. ?? ??????????, ??? ??? ????? ??????? (??????), ??? ??? ??????? ???????????. ? ??? ???????? ?????. ????? ????? ????? ??? ?????? ????? ????????? ? ????? ?????????. ?? ??????? ???????????????? (???????? ????????) ? ???????????? ????? ????. ?? ???????? ??????????? ? ??????????? ? ???????????? ????? ????. ?????????? ?????????? ?? ???????, ????: ? ??? ????????? ???????????? ??????????????. ?? ??????? ? ???????. ? ??? ???????? ??????? ???????? ????. ? ??? ????????? ??????????????? ???. ??? ???? ?????????? ????? ????. ??? ?????? ????????? ??? ????????. ? ??? ???????? ? ??????, ????? ??? ?????. ? ??? ????????? ????????? ????? ??? ?????????? ??????? (??????). ??? ???????? ????? ????????? ?? ????????????? ?????????? ??????????? ??????. ????? ?????????? ?? ???????. ????????? ? ??????? ?????? ?????????? ?????? (? ??? ?? ?????? 911). ?? ?????, ????? ??????, ???????? ?? ????????. ?? ???????? ?? ????, ????? ?????????????? ????????? ?? ????????. ?????? ??????? -- ??? ???????? ????, ??? ?? ????????? ??? ???????? ?????? ??? ?????????, ???? ?? ????? ???? ??? ???????? ?? ?????. ??? ???? ??????? ????????????, ????? ?????????? ??????? ???????. ??? ????? ?????? ?????????? ?? ????????? ???, ???????? ???????????? ????????? ??? ????????. ?????????? ? ?????, ???? ???? ????????? ?? ????????, ??? ???? ? ??? ?????????, ????????? ????? ???????? ??? ????????? ?????????. ?????????? ?????????? ?? ???????, ???? ? ??? ????? ??????? ???????? ????, ??? ???? ? ??? ???? ????????? ? ????, ????? ??? ??????. ??? ?????????? ?? ????? ???????? ??????, ??????????????? ????? ??????. ??????????? ???????? ????? ???????????? ??? ??????? ? ????? ??????? ??????. Document Revised: 06/11/2020 Document Reviewed: 06/11/2020 Elsevier Patient Education  2022 [place] exercise several times a day as dirrected.

## 2021-03-01 NOTE — Assessment & Plan Note (Addendum)
Benign Positional Vertigo symptoms on the left. Start Meclizine. Start Danny Gutierrez - [place] exercise several times a day as dirrected. There is no red flags present No driving if dizzy .

## 2021-03-26 ENCOUNTER — Ambulatory Visit (HOSPITAL_COMMUNITY)
Admission: RE | Admit: 2021-03-26 | Discharge: 2021-03-26 | Disposition: A | Discharge status: Home / Self Care | Payer: PPO | Source: Ambulatory Visit | Attending: Adult Health | Admitting: Adult Health

## 2021-03-26 ENCOUNTER — Other Ambulatory Visit: Payer: Self-pay

## 2021-03-26 DIAGNOSIS — R918 Other nonspecific abnormal finding of lung field: Secondary | ICD-10-CM | POA: Diagnosis not present

## 2021-03-26 DIAGNOSIS — I700 Atherosclerosis of aorta: Secondary | ICD-10-CM | POA: Diagnosis not present

## 2021-03-26 DIAGNOSIS — R911 Solitary pulmonary nodule: Secondary | ICD-10-CM | POA: Diagnosis not present

## 2021-03-26 LAB — POCT I-STAT CREATININE: Creatinine, Ser: 0.7 mg/dL (ref 0.61–1.24)

## 2021-03-26 MED ORDER — IOHEXOL 350 MG/ML SOLN
80.0000 mL | Freq: Once | INTRAVENOUS | Status: AC | PRN
  Administered 2021-03-26: 65 mL via INTRAVENOUS

## 2021-04-10 NOTE — Progress Notes (Signed)
Patient Care Team: Plotnikov, Evie Lacks, MD as PCP - General (Internal Medicine) Erroll Luna, MD as Consulting Physician (General Surgery) Nicholas Lose, MD as Consulting Physician (Hematology and Oncology) Kyung Rudd, MD as Consulting Physician (Radiation Oncology)  DIAGNOSIS:    ICD-10-CM   1. Malignant neoplasm of upper-inner quadrant of left breast in male, estrogen receptor positive ([place])  C50.222    Z17.0       SUMMARY OF ONCOLOGIC HISTORY: Oncology History  Malignant neoplasm of upper-inner quadrant of left breast in male, estrogen receptor positive ([place])  03/12/2019 Initial Diagnosis   Patient palpated a left breast mass. Diagnostic mammogram and US showed no right breast malignancy and a 3.0cm mass in the upper inner quadrant of the left breast with a promnent left axillary lymph node. Biopsy showed invasive mammary carcinoma in the breast and axilla, grade 2, HER-2 equivocal by IHC (2+), ER/PR+ >95%, Ki67 25%.   07/2019 - 07/2029 Anti-estrogen oral therapy   Tamoxifen   04/02/2020 Surgery   Left mastectomy ([facility]) [phone]): invasive and in situ ductal carcinoma, 2.8cm, clear margins, and 1/2 left axillary lymph nodes positive for carcinoma, 1.1cm.    04/02/2020 Cancer Staging   Staging form: Breast, AJCC 8th Edition - Pathologic stage from 04/02/2020: Stage IB (pT2, pN1a, cM0, G2, ER+, PR+, HER2-) Stage prefix: Initial diagnosis Histologic grading system: 3 grade system   04/13/2020 Oncotype testing   Mammaprint: low risk   05/19/2020 - 07/13/2020 Radiation Therapy   The patient initially received a dose of 50.4 Gy in 28 fractions to the left chest wall and supraclavicular region. This was delivered using a 3-D conformal, 4 field technique. The patient then received a boost to the mastectomy scar. This delivered an additional 10 Gy in 5 fractions using an en face electron field. The total dose was 60.4 Gy.     CHIEF COMPLIANT: Follow-up of left breast  cancer to discuss antiestrogen therapy  INTERVAL HISTORY: Danny Gutierrez is a 75 y.o. with above-mentioned history of left breast cancer who underwent a left mastectomy and is currently on radiation therapy. Mammogram on 11/23/2020 showed no evidence of malignancy. He presents to the clinic today to discuss antiestrogen therapy.   ALLERGIES:  is allergic to sulfa antibiotics.  MEDICATIONS:  Current Outpatient Medications  Medication Sig Dispense Refill   acetaminophen (TYLENOL) 500 MG tablet Take 500 mg by mouth every 6 (six) hours as needed.     Ascorbic Acid 500 MG CHEW Chew by mouth.     aspirin EC 81 MG tablet Take 81 mg by mouth daily. Swallow whole.     Cholecalciferol 1000 UNITS tablet Take 1,000 Units by mouth daily.     diphenhydrAMINE (BENADRYL) 12.5 MG/5ML elixir Take 2.5 mLs (6.25 mg total) by mouth every 6 (six) hours as needed. 120 mL 0   dorzolamide-timolol (COSOPT) 22.3-6.8 MG/ML ophthalmic solution INT 1 GTT IN OU BID  6   LUMIGAN 0.01 % SOLN 1 drop at bedtime. 1 drop in each eye at bedtime     Melatonin 10 MG TABS Take by mouth.     Multiple Minerals-Vitamins (CALCIUM-MAGNESIUM-ZINC-D3 PO) Take by mouth.     Multiple Vitamins-Minerals (CENTRUM SILVER ADULT 50+ PO) Take by mouth.     Multiple Vitamins-Minerals (OCUVITE EYE HEALTH FORMULA PO) Take by mouth daily.     Multiple Vitamins-Minerals (ONE-A-DAY PROACTIVE 65+ PO) Take by mouth.     OVER THE COUNTER MEDICATION Focus Factor     Pollen Extracts (PROSTAT PO) Take by  mouth. prostagenix     SUPER B COMPLEX/C PO Take by mouth.     tamoxifen (NOLVADEX) 20 MG tablet Take 1 tablet (20 mg total) by mouth daily. 90 tablet 3   No current facility-administered medications for this visit.    PHYSICAL EXAMINATION: ECOG PERFORMANCE STATUS: 1 - Symptomatic but completely ambulatory  Vitals:   04/12/21 0913  BP: (!) 161/86  Pulse: 78  Resp: 19  Temp: 97.9 F (36.6 C)  SpO2: 97%   Filed Weights   04/12/21 0913   Weight: 156 lb 1.6 oz (70.8 kg)    BREAST: No palpable masses or nodules in either right or left breasts. No palpable axillary supraclavicular or infraclavicular adenopathy no breast tenderness or nipple discharge. (exam performed in the presence of a chaperone)  LABORATORY DATA:  I have reviewed the data as listed CMP Latest Ref Rng & Units 03/26/2021 02/10/2021 03/18/2020  Glucose 70 - 99 mg/dL - 99 122(H)  BUN 6 - 23 mg/dL - 18 12  Creatinine 0.61 - 1.24 mg/dL 0.70 0.77 0.78  Sodium 135 - 145 mEq/L - 138 139  Potassium 3.5 - 5.1 mEq/L - 3.9 4.0  Chloride 96 - 112 mEq/L - 104 105  CO2 19 - 32 mEq/L - 27 29  Calcium 8.4 - 10.5 mg/dL - 9.0 9.6  Total Protein 6.0 - 8.3 g/dL - 6.8 7.4  Total Bilirubin 0.2 - 1.2 mg/dL - 0.6 0.6  Alkaline Phos 39 - 117 U/L - 34(L) 69  AST 0 - 37 U/L - 18 16  ALT 0 - 53 U/L - 21 21    Lab Results  Component Value Date   WBC 6.1 02/10/2021   HGB 14.3 02/10/2021   HCT 44.3 02/10/2021   MCV 85.0 02/10/2021   PLT 190.0 02/10/2021   NEUTROABS 4.4 02/10/2021    ASSESSMENT & PLAN:  Malignant neoplasm of upper-inner quadrant of left breast in male, estrogen receptor positive ([facility]) 04/02/2020:Left mastectomy ([facility]): invasive and in situ ductal carcinoma, 2.8cm, clear margins, and 1/2 left axillary lymph nodes positive for carcinoma with extranodal extension, 1.1cm. grade 2, HER-2 equivocal by IHC (2+) FISH negative, ER/PR+ >95%, Ki67 25%.   MammaPrint: Luminal type A, low risk (no benefit from chemotherapy)   Treatment plan: 1. adjuvant radiation started 05/19/2020 (will complete 07/06/2020) 2. Adjuvant antiestrogen therapy with tamoxifen x10 years started 08/04/2020   Tamoxifen toxicities: Other than mild fatigue he is tolerating the treatment fairly well. He had a brief episode of vertigo and was thinking it might be tamoxifen related but it got better and he has not had any further issues with vertigo anymore.  Breast cancer surveillance: 1.   Breast exam 04/12/2021: Benign 2. right breast mammogram 11/23/2020: Benign  CT chest 03/29/2021: Previously noted pulmonary nodules are stable considered benign, hepatic steatosis, postsurgical changes.  Return to clinic in 1 year for follow-up    No orders of the defined types were placed in this encounter.  The patient has a good understanding of the overall plan. he agrees with it. he will call with any problems that may develop before the next visit here.  Total time spent: 20 mins including face to face time and time spent for planning, charting and coordination of care  Rulon Eisenmenger, MD, MPH 04/12/2021  I, Thana Ates, am acting as scribe for Dr. Nicholas Lose.  I have reviewed the above documentation for accuracy and completeness, and I agree with the above.

## 2021-04-12 ENCOUNTER — Inpatient Hospital Stay: Payer: PPO | Attending: Hematology and Oncology | Admitting: Hematology and Oncology

## 2021-04-12 ENCOUNTER — Other Ambulatory Visit: Payer: Self-pay

## 2021-04-12 DIAGNOSIS — Z79810 Long term (current) use of selective estrogen receptor modulators (SERMs): Secondary | ICD-10-CM | POA: Diagnosis not present

## 2021-04-12 DIAGNOSIS — Z923 Personal history of irradiation: Secondary | ICD-10-CM | POA: Diagnosis not present

## 2021-04-12 DIAGNOSIS — C50222 Malignant neoplasm of upper-inner quadrant of left male breast: Secondary | ICD-10-CM | POA: Diagnosis not present

## 2021-04-12 DIAGNOSIS — Z170 Estrogen receptor positive status [ER+]: Secondary | ICD-10-CM | POA: Insufficient documentation

## 2021-04-12 DIAGNOSIS — Z9012 Acquired absence of left breast and nipple: Secondary | ICD-10-CM | POA: Diagnosis not present

## 2021-04-12 NOTE — Assessment & Plan Note (Signed)
04/02/2020:Left mastectomy ([facility]): invasive and in situ ductal carcinoma, 2.8cm, clear margins, and 1/2 left axillary lymph nodes positive for carcinomawith extranodal extension, 1.1cm. grade 2, HER-2 equivocal by IHC (2+)FISH negative, ER/PR+ >95%, Ki67 25%.  MammaPrint: Luminal type A, low risk (no benefit from chemotherapy)  Treatment plan: 1.adjuvant radiation started 05/19/2020 (will complete 07/06/2020) 2.Adjuvant antiestrogen therapywith tamoxifen x10 years started 08/04/2020  Tamoxifen toxicities:  Breast cancer surveillance: 1.  Breast exam 04/12/2021: Benign 2. right breast mammogram 11/23/2020: Benign  CT chest 03/29/2021: Previously noted pulmonary nodules are stable considered benign, hepatic steatosis, postsurgical changes.  Return to clinic in 1 year for follow-up

## 2021-04-16 ENCOUNTER — Ambulatory Visit: Payer: PPO | Admitting: Hematology and Oncology

## 2021-04-16 DIAGNOSIS — H5203 Hypermetropia, bilateral: Secondary | ICD-10-CM | POA: Diagnosis not present

## 2021-04-16 DIAGNOSIS — H401421 Capsular glaucoma with pseudoexfoliation of lens, left eye, mild stage: Secondary | ICD-10-CM | POA: Diagnosis not present

## 2021-04-16 DIAGNOSIS — H524 Presbyopia: Secondary | ICD-10-CM | POA: Diagnosis not present

## 2021-04-16 DIAGNOSIS — H268 Other specified cataract: Secondary | ICD-10-CM | POA: Diagnosis not present

## 2021-04-16 DIAGNOSIS — H401412 Capsular glaucoma with pseudoexfoliation of lens, right eye, moderate stage: Secondary | ICD-10-CM | POA: Diagnosis not present

## 2021-04-16 DIAGNOSIS — H401432 Capsular glaucoma with pseudoexfoliation of lens, bilateral, moderate stage: Secondary | ICD-10-CM | POA: Insufficient documentation

## 2021-04-16 DIAGNOSIS — H2513 Age-related nuclear cataract, bilateral: Secondary | ICD-10-CM | POA: Diagnosis not present

## 2021-05-03 ENCOUNTER — Ambulatory Visit: Payer: PPO

## 2021-05-26 DIAGNOSIS — H0100A Unspecified blepharitis right eye, upper and lower eyelids: Secondary | ICD-10-CM | POA: Diagnosis not present

## 2021-05-26 DIAGNOSIS — H2513 Age-related nuclear cataract, bilateral: Secondary | ICD-10-CM | POA: Diagnosis not present

## 2021-05-26 DIAGNOSIS — H0100B Unspecified blepharitis left eye, upper and lower eyelids: Secondary | ICD-10-CM | POA: Diagnosis not present

## 2021-05-26 DIAGNOSIS — H401412 Capsular glaucoma with pseudoexfoliation of lens, right eye, moderate stage: Secondary | ICD-10-CM | POA: Diagnosis not present

## 2021-05-26 IMAGING — MG MM BREAST SURGICAL SPECIMEN
1 series · 2 of 2 positions shown · non-contrast
Comparison: Previous exam(s).

CLINICAL DATA: Post excision of a metastatic left axillary lymph
node.

EXAM:
SPECIMEN RADIOGRAPH OF THE LEFT BREAST

[Series 2: L · left · 0.07mm/px · 2 of 2 slices shown]
[im 1/2]
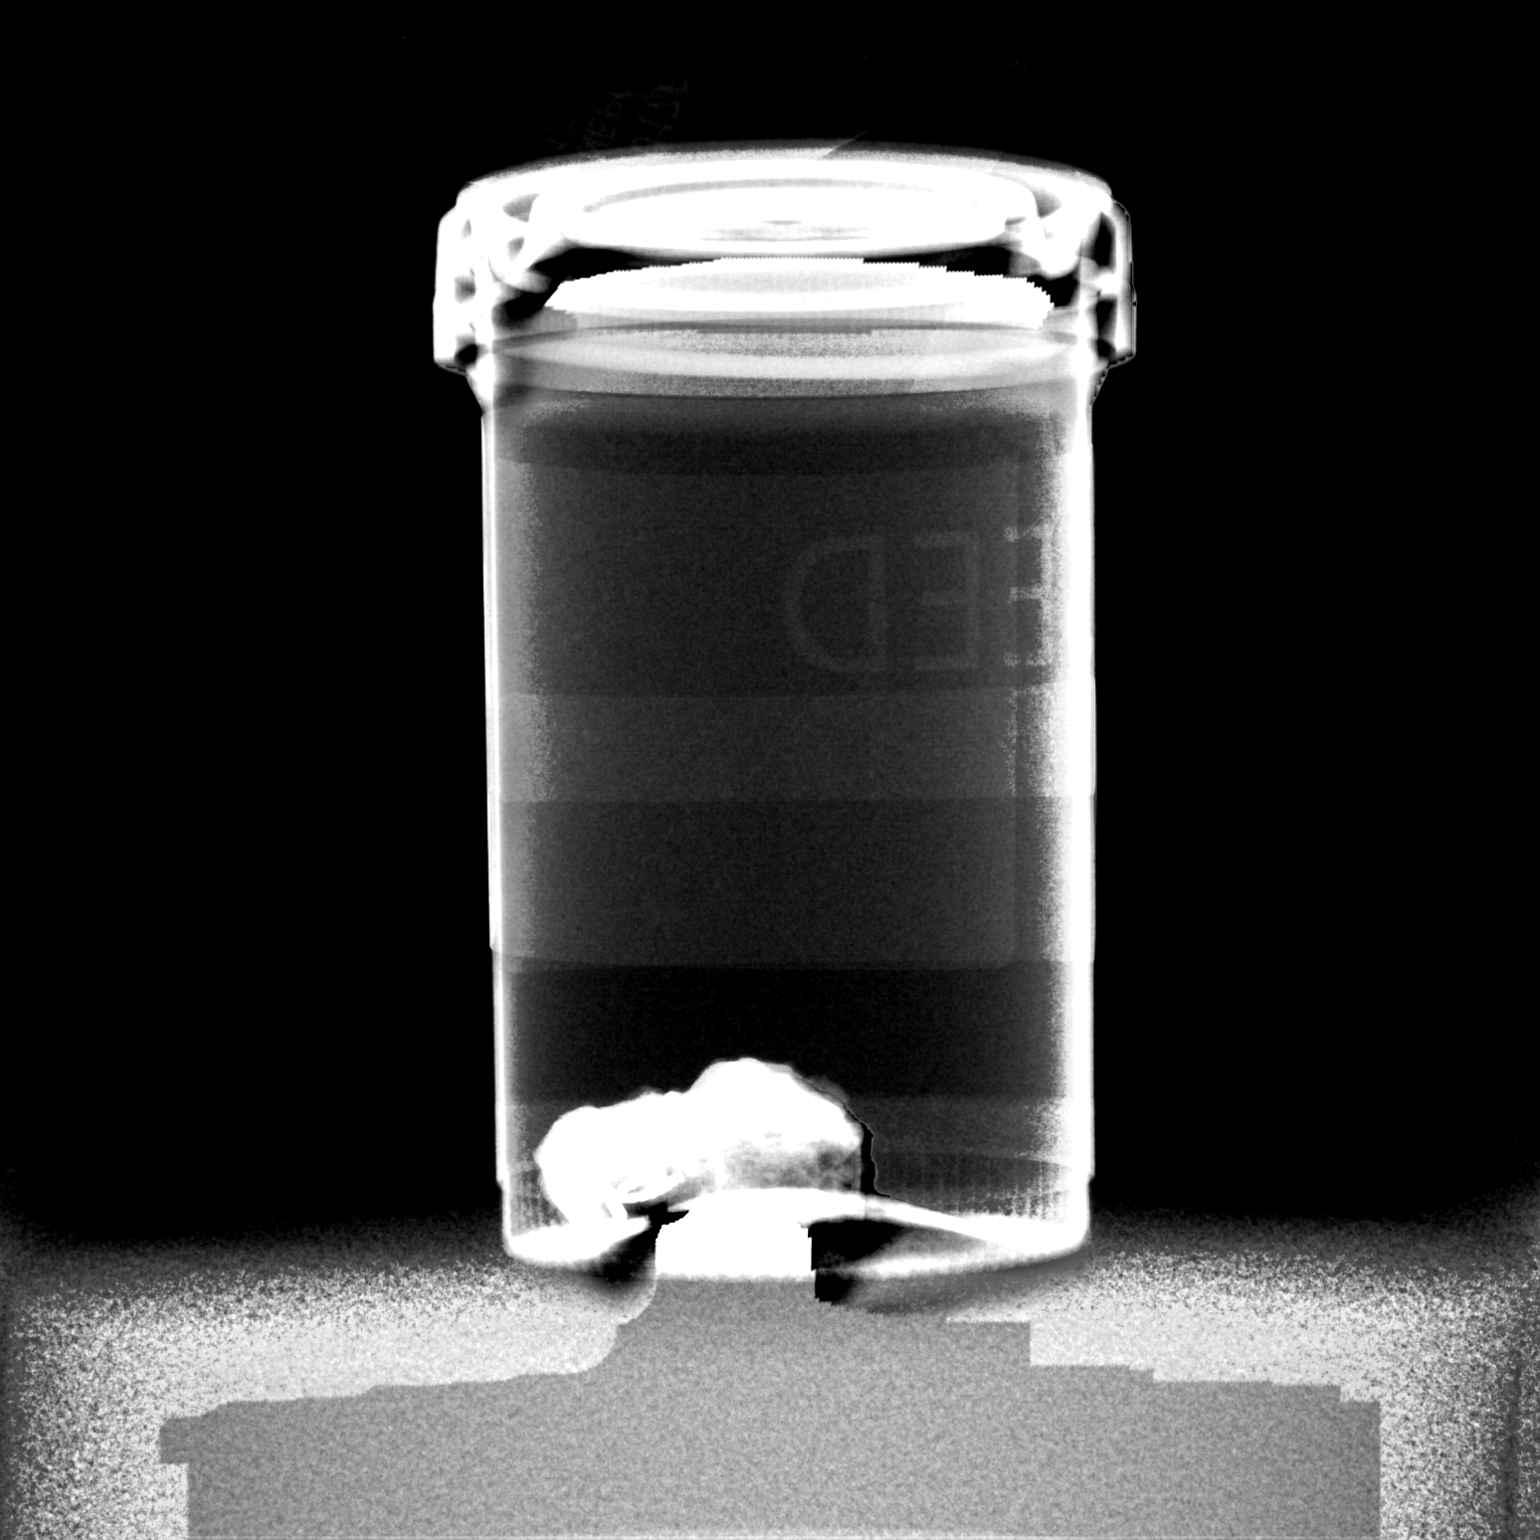
[im 2/2]
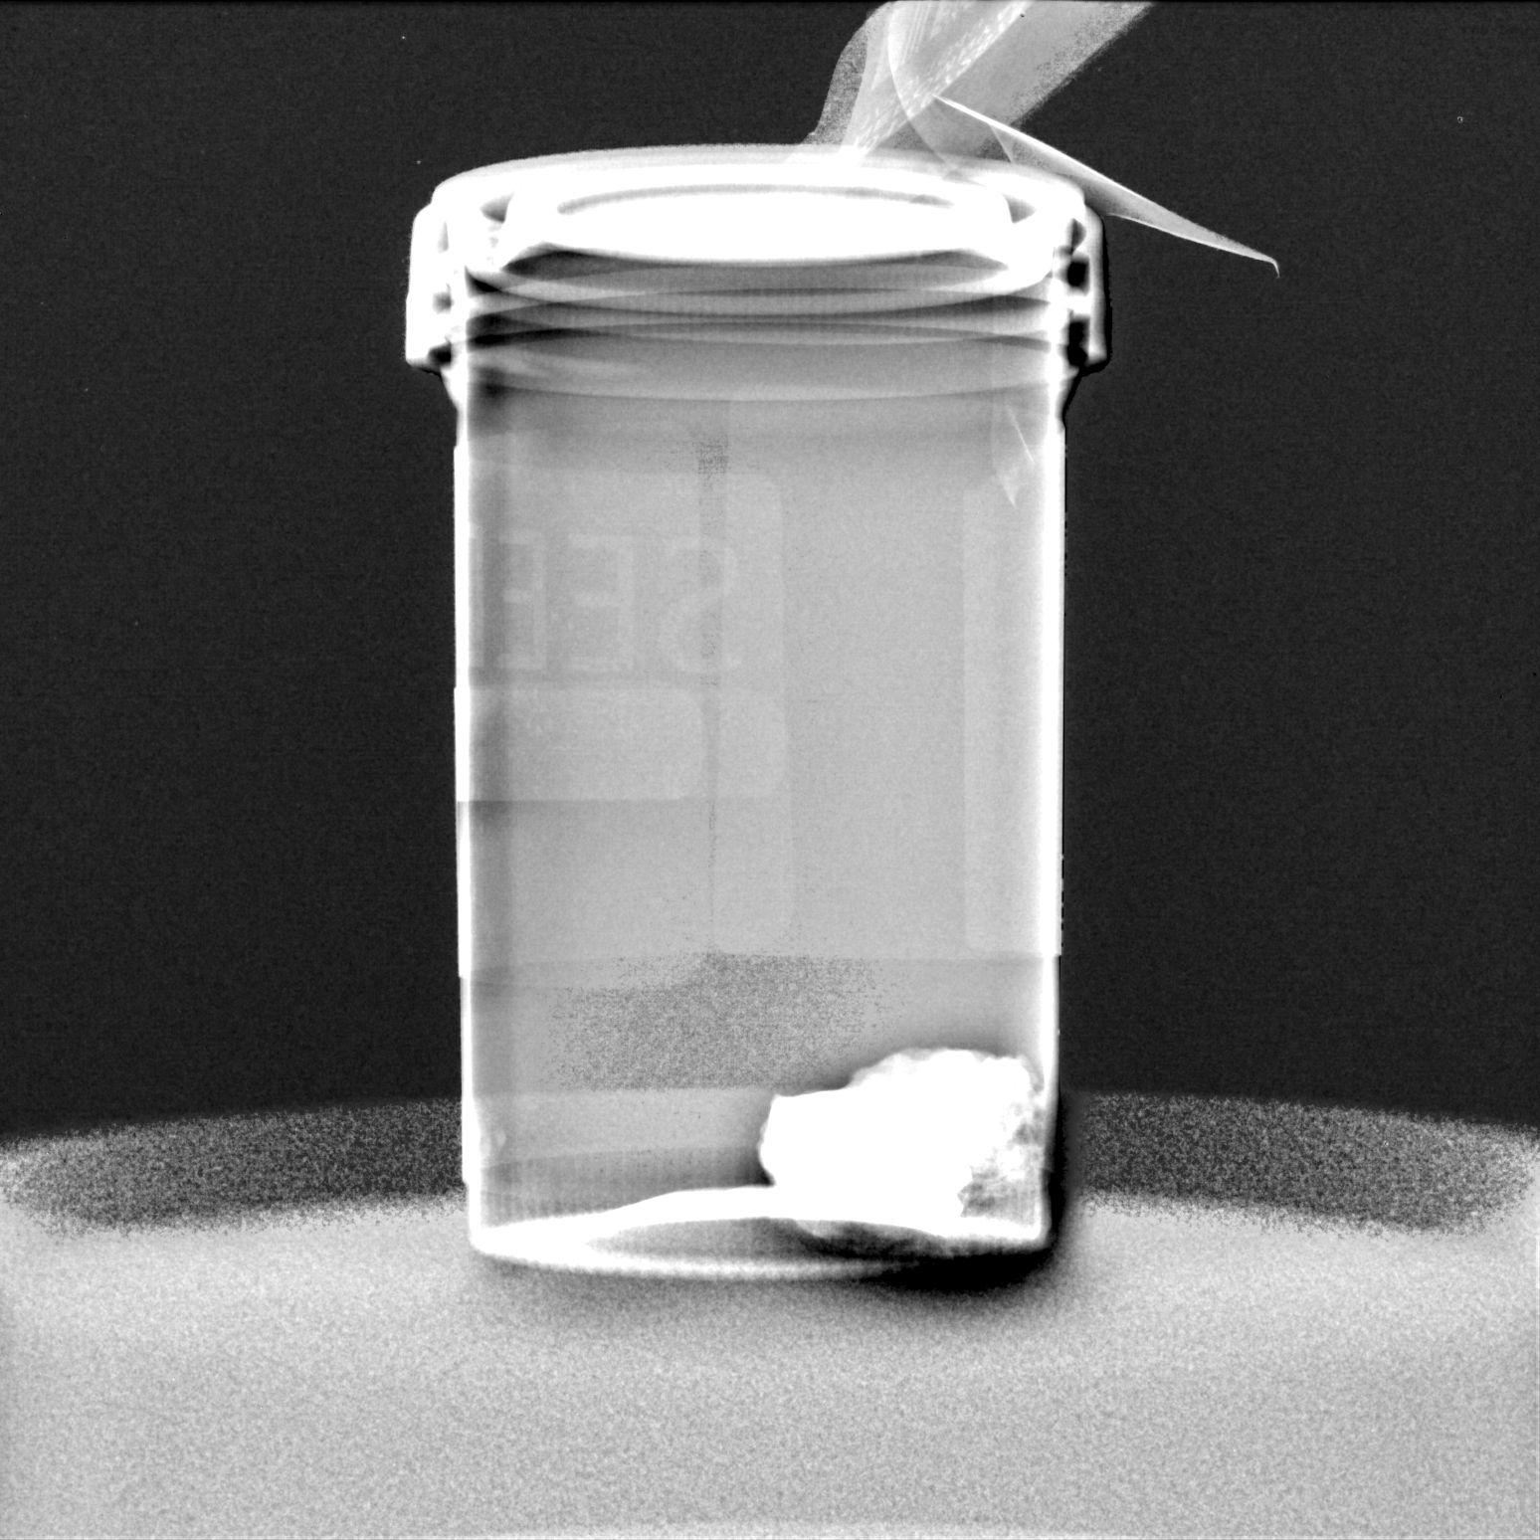

[2 of 2 positions shown; findings below may reference images not displayed]

FINDINGS: Status post excision of the left axillary lymph node. The
radioactive seed and Q shaped biopsy marker clip are present,
completely intact, and were marked for pathology.
IMPRESSION: Specimen radiograph of the excised metastatic left axillary lymph
node.

## 2021-06-02 DIAGNOSIS — H401421 Capsular glaucoma with pseudoexfoliation of lens, left eye, mild stage: Secondary | ICD-10-CM | POA: Diagnosis not present

## 2021-06-03 ENCOUNTER — Other Ambulatory Visit: Payer: Self-pay | Admitting: Hematology and Oncology

## 2021-06-10 DIAGNOSIS — H401421 Capsular glaucoma with pseudoexfoliation of lens, left eye, mild stage: Secondary | ICD-10-CM | POA: Diagnosis not present

## 2021-06-10 DIAGNOSIS — H2513 Age-related nuclear cataract, bilateral: Secondary | ICD-10-CM | POA: Diagnosis not present

## 2021-06-10 DIAGNOSIS — H401412 Capsular glaucoma with pseudoexfoliation of lens, right eye, moderate stage: Secondary | ICD-10-CM | POA: Diagnosis not present

## 2021-07-29 ENCOUNTER — Encounter (HOSPITAL_COMMUNITY): Payer: Self-pay

## 2021-08-11 ENCOUNTER — Telehealth: Payer: Self-pay

## 2021-08-11 NOTE — Telephone Encounter (Signed)
Pt is states that he received a message via Mychart telling him that a Cologuard has been ordered.   At this time pt isn't interested in a Cologuard kit.  FYI

## 2021-08-20 ENCOUNTER — Encounter: Payer: Self-pay | Admitting: Internal Medicine

## 2021-08-20 DIAGNOSIS — H02006 Unspecified entropion of left eye, unspecified eyelid: Secondary | ICD-10-CM | POA: Insufficient documentation

## 2021-08-20 DIAGNOSIS — H401432 Capsular glaucoma with pseudoexfoliation of lens, bilateral, moderate stage: Secondary | ICD-10-CM | POA: Diagnosis not present

## 2021-08-20 DIAGNOSIS — H02005 Unspecified entropion of left lower eyelid: Secondary | ICD-10-CM | POA: Diagnosis not present

## 2021-08-20 DIAGNOSIS — H2513 Age-related nuclear cataract, bilateral: Secondary | ICD-10-CM | POA: Diagnosis not present

## 2022-01-16 IMAGING — MG MM DIGITAL DIAGNOSTIC UNILAT*R* W/ TOMO W/ CAD
4 series · 4 of 12 positions shown · non-contrast
Comparison: 03/04/2020

ACR Breast Density Category a: The breast tissue is almost entirely
fatty.

CLINICAL DATA: Patient referring clinician reports a right breast
lump. The patient does not feel this and is not certain where his
clinician felt this. Patient has a history of a left lumpectomy for
breast carcinoma, diagnosed on February 19, 2020. Patient is
currently on tamoxifen therapy.

EXAM:
DIGITAL DIAGNOSTIC UNILATERAL RIGHT MAMMOGRAM WITH TOMOSYNTHESIS AND
CAD
TECHNIQUE: Right digital diagnostic mammography and breast tomosynthesis was
performed. The images were evaluated with computer-aided detection.

[R CC synth-2D]
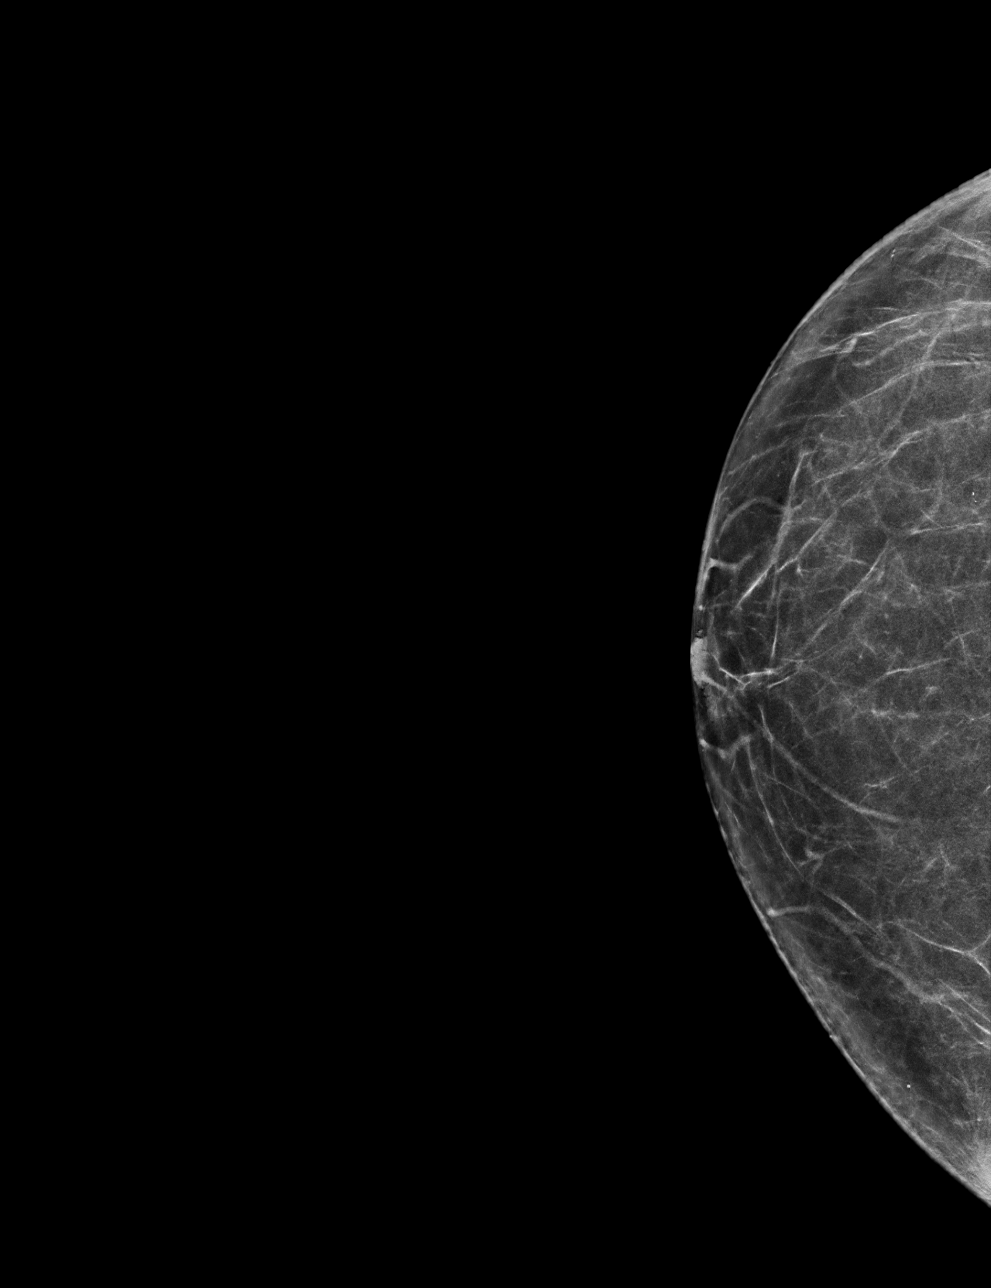

[R MLO synth-2D]
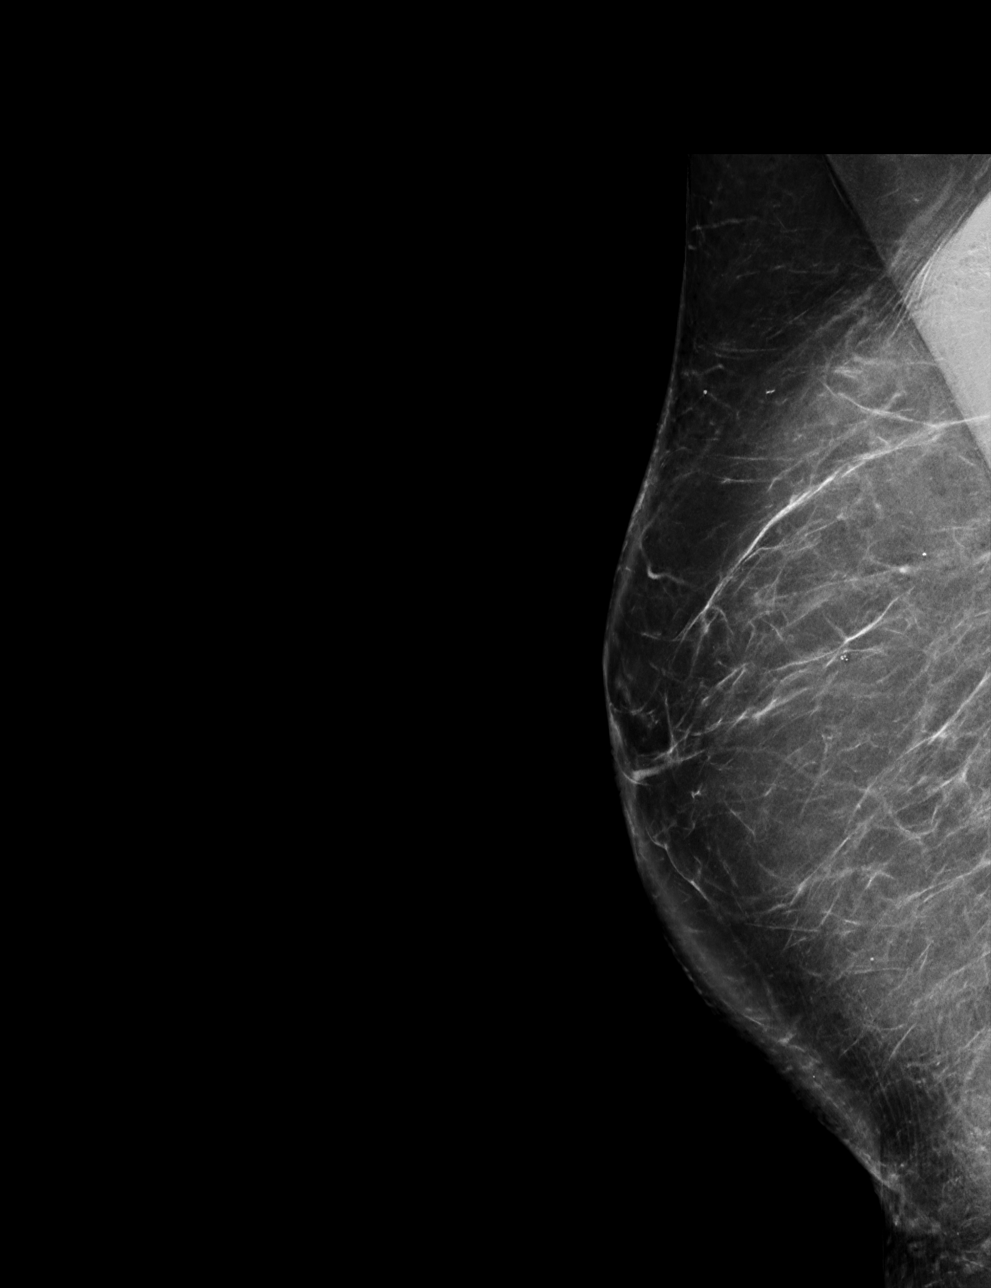

[R MLO tomo · tomo slice 43/86.0]
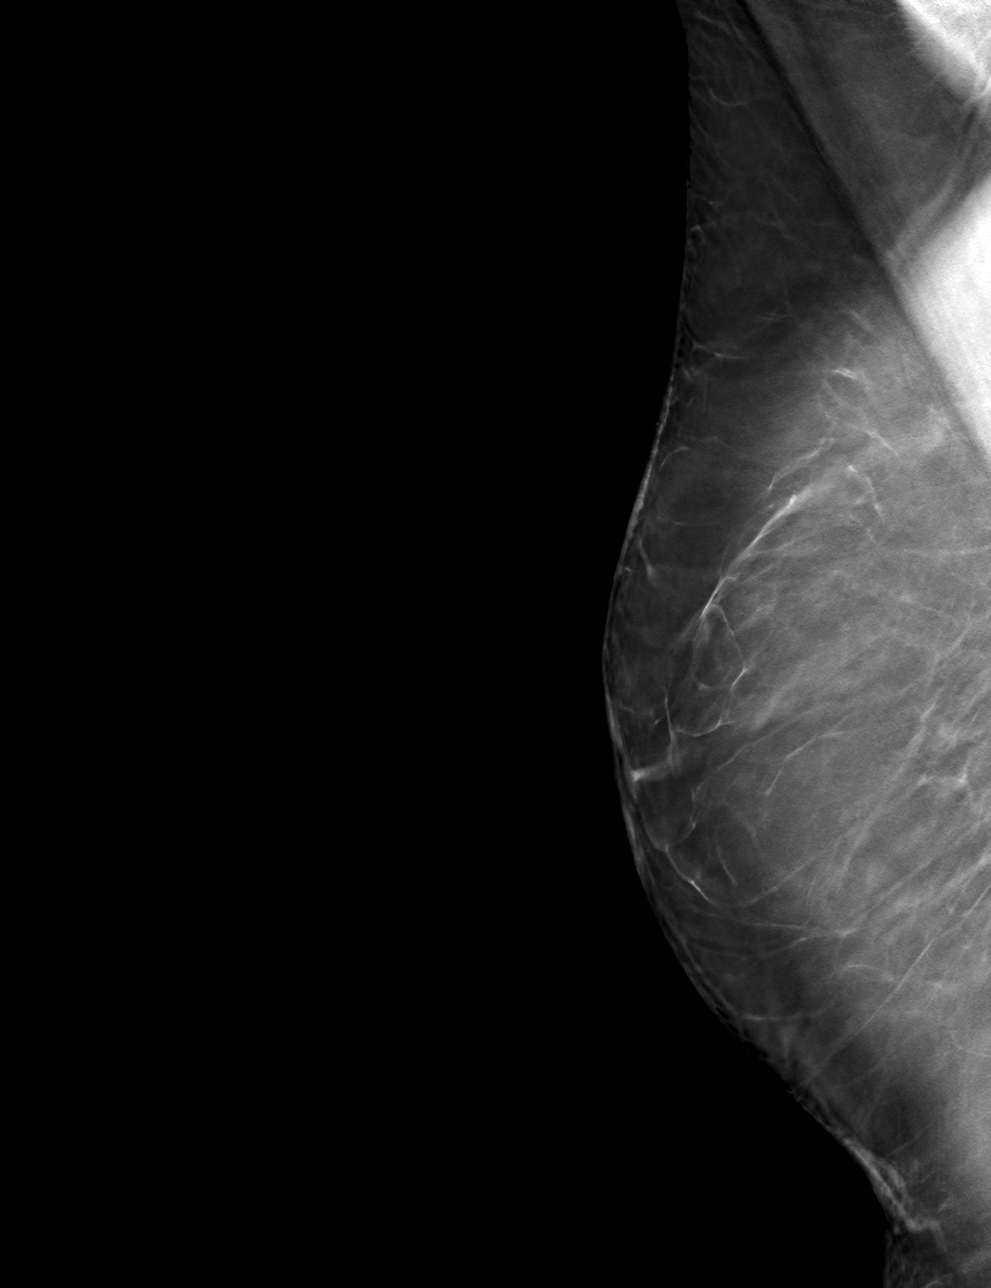

[R CC tomo · tomo slice 33/65.0]
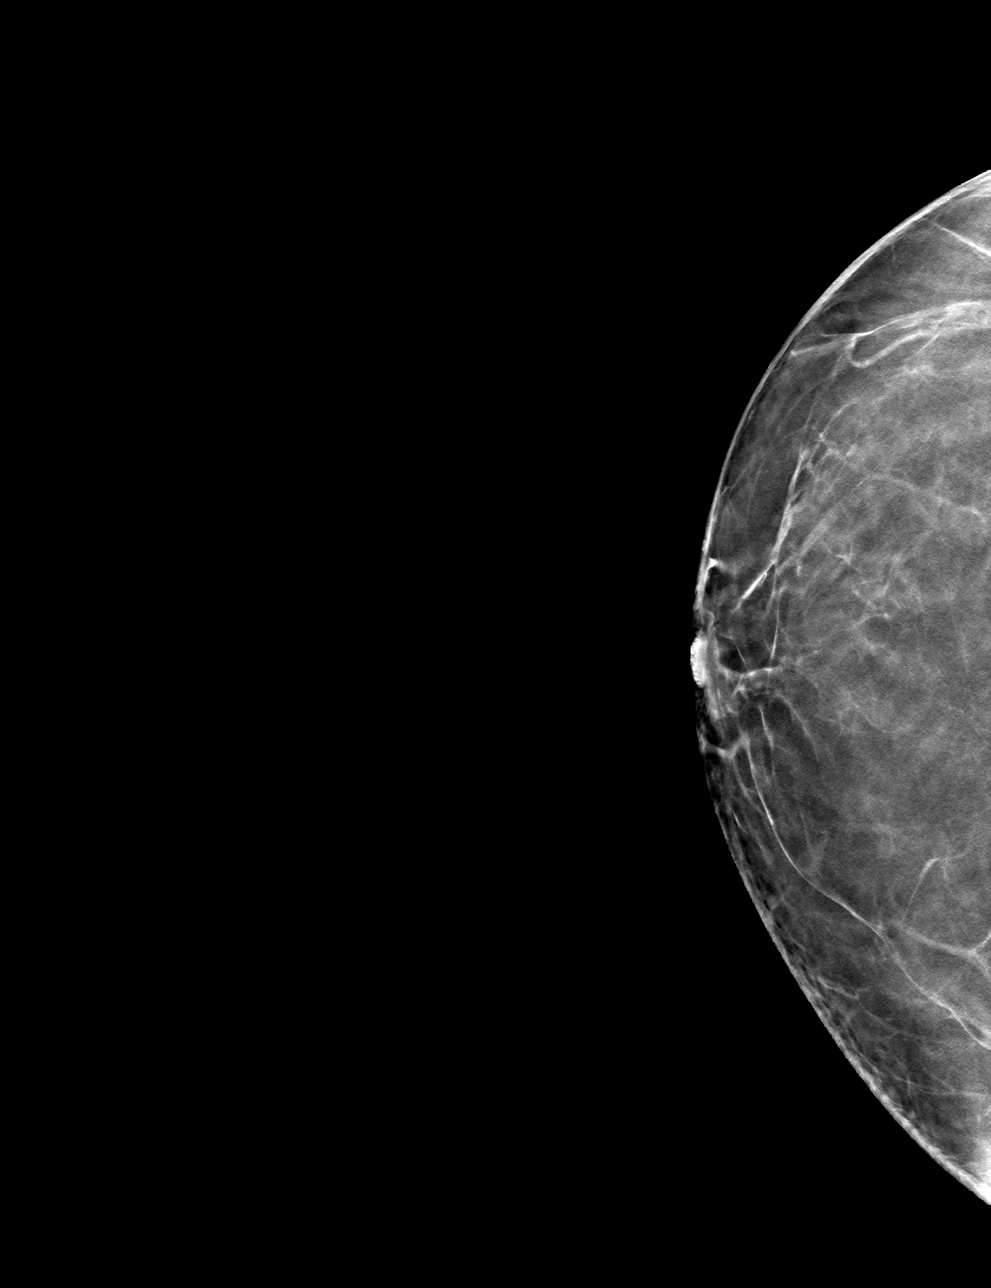

[4 of 12 positions shown; findings below may reference images not displayed]

FINDINGS: There are no masses, areas of architectural distortion or suspicious
calcifications. No mammographic change.
IMPRESSION: No evidence of right breast malignancy.

RECOMMENDATION:
1. Clinical follow-up for the reported palpable abnormality.
2. Consider annual screening right breast mammography given the
patient's personal history of left breast carcinoma.

I have discussed the findings and recommendations with the patient.
If applicable, a reminder letter will be sent to the patient
regarding the next appointment.

BI-RADS CATEGORY  1: Negative.

## 2022-02-14 ENCOUNTER — Ambulatory Visit: Payer: PPO | Admitting: Internal Medicine

## 2022-02-24 DIAGNOSIS — H268 Other specified cataract: Secondary | ICD-10-CM | POA: Diagnosis not present

## 2022-02-24 DIAGNOSIS — H524 Presbyopia: Secondary | ICD-10-CM | POA: Diagnosis not present

## 2022-02-24 DIAGNOSIS — H52203 Unspecified astigmatism, bilateral: Secondary | ICD-10-CM | POA: Diagnosis not present

## 2022-02-24 DIAGNOSIS — H5203 Hypermetropia, bilateral: Secondary | ICD-10-CM | POA: Diagnosis not present

## 2022-02-24 DIAGNOSIS — H02005 Unspecified entropion of left lower eyelid: Secondary | ICD-10-CM | POA: Diagnosis not present

## 2022-02-24 DIAGNOSIS — H2513 Age-related nuclear cataract, bilateral: Secondary | ICD-10-CM | POA: Diagnosis not present

## 2022-02-24 DIAGNOSIS — H401432 Capsular glaucoma with pseudoexfoliation of lens, bilateral, moderate stage: Secondary | ICD-10-CM | POA: Diagnosis not present

## 2022-03-02 ENCOUNTER — Ambulatory Visit (INDEPENDENT_AMBULATORY_CARE_PROVIDER_SITE_OTHER): Payer: PPO | Admitting: Internal Medicine

## 2022-03-02 ENCOUNTER — Encounter: Payer: Self-pay | Admitting: Internal Medicine

## 2022-03-02 VITALS — BP 120/82 | HR 72 | Temp 98.2°F | Ht 66.0 in | Wt 166.2 lb

## 2022-03-02 DIAGNOSIS — I2583 Coronary atherosclerosis due to lipid rich plaque: Secondary | ICD-10-CM

## 2022-03-02 DIAGNOSIS — R002 Palpitations: Secondary | ICD-10-CM | POA: Diagnosis not present

## 2022-03-02 DIAGNOSIS — I700 Atherosclerosis of aorta: Secondary | ICD-10-CM

## 2022-03-02 DIAGNOSIS — Z0000 Encounter for general adult medical examination without abnormal findings: Secondary | ICD-10-CM

## 2022-03-02 DIAGNOSIS — N320 Bladder-neck obstruction: Secondary | ICD-10-CM | POA: Diagnosis not present

## 2022-03-02 DIAGNOSIS — I2510 Atherosclerotic heart disease of native coronary artery without angina pectoris: Secondary | ICD-10-CM | POA: Diagnosis not present

## 2022-03-02 LAB — CBC WITH DIFFERENTIAL/PLATELET
Basophils Absolute: 0 10*3/uL (ref 0.0–0.1)
Basophils Relative: 0.5 % (ref 0.0–3.0)
Eosinophils Absolute: 0.1 10*3/uL (ref 0.0–0.7)
Eosinophils Relative: 1.9 % (ref 0.0–5.0)
HCT: 43 % (ref 39.0–52.0)
Hemoglobin: 14 g/dL (ref 13.0–17.0)
Lymphocytes Relative: 16.6 % (ref 12.0–46.0)
Lymphs Abs: 1.2 10*3/uL (ref 0.7–4.0)
MCHC: 32.6 g/dL (ref 30.0–36.0)
MCV: 83.8 fl (ref 78.0–100.0)
Monocytes Absolute: 0.6 10*3/uL (ref 0.1–1.0)
Monocytes Relative: 7.8 % (ref 3.0–12.0)
Neutro Abs: 5.2 10*3/uL (ref 1.4–7.7)
Neutrophils Relative %: 73.2 % (ref 43.0–77.0)
Platelets: 162 10*3/uL (ref 150.0–400.0)
RBC: 5.13 Mil/uL (ref 4.22–5.81)
RDW: 15.1 % (ref 11.5–15.5)
WBC: 7.1 10*3/uL (ref 4.0–10.5)

## 2022-03-02 LAB — HEPATIC FUNCTION PANEL
ALT: 15 U/L (ref 0–53)
AST: 12 U/L (ref 0–37)
Albumin: 3.7 g/dL (ref 3.5–5.2)
Alkaline Phosphatase: 47 U/L (ref 39–117)
Bilirubin, Direct: 0.1 mg/dL (ref 0.0–0.3)
Total Bilirubin: 0.4 mg/dL (ref 0.2–1.2)
Total Protein: 6.5 g/dL (ref 6.0–8.3)

## 2022-03-02 LAB — LIPID PANEL
Cholesterol: 181 mg/dL (ref 0–200)
HDL: 39.7 mg/dL (ref >39.00)
LDL Cholesterol: 114 mg/dL — ABNORMAL HIGH (ref 0–99)
NonHDL: 141.6
Total CHOL/HDL Ratio: 5
Triglycerides: 140 mg/dL (ref 0.0–149.0)
VLDL: 28 mg/dL (ref 0.0–40.0)

## 2022-03-02 LAB — COMPREHENSIVE METABOLIC PANEL
ALT: 15 U/L (ref 0–53)
AST: 12 U/L (ref 0–37)
Albumin: 3.7 g/dL (ref 3.5–5.2)
Alkaline Phosphatase: 47 U/L (ref 39–117)
BUN: 16 mg/dL (ref 6–23)
CO2: 26 mEq/L (ref 19–32)
Calcium: 8.7 mg/dL (ref 8.4–10.5)
Chloride: 107 mEq/L (ref 96–112)
Creatinine, Ser: 0.68 mg/dL (ref 0.40–1.50)
GFR: 91.5 mL/min (ref >60.00)
Glucose, Bld: 92 mg/dL (ref 70–99)
Potassium: 3.9 mEq/L (ref 3.5–5.1)
Sodium: 140 mEq/L (ref 135–145)
Total Bilirubin: 0.4 mg/dL (ref 0.2–1.2)
Total Protein: 6.5 g/dL (ref 6.0–8.3)

## 2022-03-02 LAB — SEDIMENTATION RATE: Sed Rate: 12 mm/hr (ref 0–20)

## 2022-03-02 LAB — TSH: TSH: 3.82 u[IU]/mL (ref 0.35–5.50)

## 2022-03-02 LAB — PSA: PSA: 2.48 ng/mL (ref 0.10–4.00)

## 2022-03-02 MED ORDER — FISH OIL 1000 MG PO CAPS: 2.0000 | ORAL_CAPSULE | Freq: Every day | ORAL | 3 refills | Status: AC

## 2022-03-02 NOTE — Assessment & Plan Note (Addendum)
Chest CT 2022: There is aortic atherosclerosis, as well as atherosclerosis of the great vessels of the mediastinum and the coronary arteries, including calcified atherosclerotic plaque in the left main and left anterior descending coronary arteries.  On ASA Start fish oil Pt declined statins

## 2022-03-02 NOTE — Progress Notes (Signed)
Subjective:  Patient ID: Danny Gutierrez, male    DOB: [birth year]  Age: 76 y.o. MRN: [id number]  CC: Annual Exam   HPI Danny Gutierrez presents for a well exam  Outpatient Medications Prior to Visit  Medication Sig Dispense Refill   acetaminophen (TYLENOL) 500 MG tablet Take 500 mg by mouth every 6 (six) hours as needed.     Ascorbic Acid 500 MG CHEW Chew by mouth.     aspirin EC 81 MG tablet Take 81 mg by mouth daily. Swallow whole.     Cholecalciferol 1000 UNITS tablet Take 1,000 Units by mouth daily.     diphenhydrAMINE (BENADRYL) 12.5 MG/5ML elixir Take 2.5 mLs (6.25 mg total) by mouth every 6 (six) hours as needed. 120 mL 0   dorzolamide-timolol (COSOPT) 22.3-6.8 MG/ML ophthalmic solution INT 1 GTT IN OU BID  6   LUMIGAN 0.01 % SOLN 1 drop at bedtime. 1 drop in each eye at bedtime     Melatonin 10 MG TABS Take by mouth.     Multiple Minerals-Vitamins (CALCIUM-MAGNESIUM-ZINC-D3 PO) Take by mouth.     Multiple Vitamins-Minerals (CENTRUM SILVER ADULT 50+ PO) Take by mouth.     Multiple Vitamins-Minerals (OCUVITE EYE HEALTH FORMULA PO) Take by mouth daily.     Multiple Vitamins-Minerals (ONE-A-DAY PROACTIVE 65+ PO) Take by mouth.     OVER THE COUNTER MEDICATION Focus Factor     Pollen Extracts (PROSTAT PO) Take by mouth. prostagenix     SUPER B COMPLEX/C PO Take by mouth.     tamoxifen (NOLVADEX) 20 MG tablet TAKE 1 TABLET(20 MG) BY MOUTH DAILY (Patient not taking: Reported on 03/02/2022) 90 tablet 3   No facility-administered medications prior to visit.    ROS: Review of Systems  Constitutional:  Positive for fatigue. Negative for appetite change and unexpected weight change.  HENT:  Negative for congestion, nosebleeds, sneezing, sore throat and trouble swallowing.   Eyes:  Negative for itching and visual disturbance.  Respiratory:  Negative for cough.   Cardiovascular:  Negative for chest pain, palpitations and leg swelling.  Gastrointestinal:  Negative for  abdominal distention, blood in stool, diarrhea and nausea.  Genitourinary:  Negative for frequency and hematuria.  Musculoskeletal:  Negative for back pain, gait problem, joint swelling and neck pain.  Skin:  Negative for rash.  Neurological:  Negative for dizziness, tremors, speech difficulty and weakness.  Psychiatric/Behavioral:  Negative for agitation, dysphoric mood and sleep disturbance. The patient is not nervous/anxious.     Objective:  BP 120/82 (BP Location: Left Arm)   Pulse 72   Temp 98.2 F (36.8 C) (Oral)   Ht '5\' 6"'$  (1.676 m)   Wt 166 lb 3.2 oz (75.4 kg)   SpO2 95%   BMI 26.83 kg/m   BP Readings from Last 3 Encounters:  03/02/22 120/82  04/12/21 (!) 161/86  03/01/21 110/78    Wt Readings from Last 3 Encounters:  03/02/22 166 lb 3.2 oz (75.4 kg)  04/12/21 156 lb 1.6 oz (70.8 kg)  03/01/21 158 lb 12.8 oz (72 kg)    Physical Exam Constitutional:      General: He is not in acute distress.    Appearance: He is well-developed. He is obese.     Comments: NAD  Eyes:     Conjunctiva/sclera: Conjunctivae normal.     Pupils: Pupils are equal, round, and reactive to light.  Neck:     Thyroid: No thyromegaly.     Vascular: No JVD.  Cardiovascular:  Rate and Rhythm: Normal rate and regular rhythm.     Heart sounds: Normal heart sounds. No murmur heard.    No friction rub. No gallop.  Pulmonary:     Effort: Pulmonary effort is normal. No respiratory distress.     Breath sounds: Normal breath sounds. No wheezing or rales.  Chest:     Chest wall: No tenderness.  Abdominal:     General: Bowel sounds are normal. There is no distension.     Palpations: Abdomen is soft. There is no mass.     Tenderness: There is no abdominal tenderness. There is no guarding or rebound.  Musculoskeletal:        General: No tenderness. Normal range of motion.     Cervical back: Normal range of motion.  Lymphadenopathy:     Cervical: No cervical adenopathy.  Skin:    General:  Skin is warm and dry.     Findings: No rash.  Neurological:     Mental Status: He is alert and oriented to person, place, and time.     Cranial Nerves: No cranial nerve deficit.     Motor: No abnormal muscle tone.     Coordination: Coordination normal.     Gait: Gait normal.     Deep Tendon Reflexes: Reflexes are normal and symmetric.  Psychiatric:        Behavior: Behavior normal.        Thought Content: Thought content normal.        Judgment: Judgment normal.    Rectal - declined  Procedure: EKG Indication: CAD Impression: NSR. No acute changes.   Lab Results  Component Value Date   WBC 6.1 02/10/2021   HGB 14.3 02/10/2021   HCT 44.3 02/10/2021   PLT 190.0 02/10/2021   GLUCOSE 99 02/10/2021   CHOL 205 (H) 02/10/2021   TRIG 116.0 02/10/2021   HDL 39.10 02/10/2021   LDLDIRECT 165.0 02/05/2019   LDLCALC 143 (H) 02/10/2021   ALT 21 02/10/2021   AST 18 02/10/2021   NA 138 02/10/2021   K 3.9 02/10/2021   CL 104 02/10/2021   CREATININE 0.70 03/26/2021   BUN 18 02/10/2021   CO2 27 02/10/2021   TSH 3.93 02/10/2021   PSA 1.95 02/15/2021    CT Chest W Contrast  Result Date: 03/29/2021 CLINICAL DATA:  76 year old male with history of left-sided breast cancer status post left mastectomy in October 2021 followed by radiation therapy and hormonal therapy. Follow-up study. EXAM: CT CHEST WITH CONTRAST TECHNIQUE: Multidetector CT imaging of the chest was performed during intravenous contrast administration. CONTRAST:  56m OMNIPAQUE IOHEXOL 350 MG/ML SOLN COMPARISON:  CT the chest, abdomen and pelvis 04/19/2020. FINDINGS: Cardiovascular: Heart size is normal. There is no significant pericardial fluid, thickening or pericardial calcification. There is aortic atherosclerosis, as well as atherosclerosis of the great vessels of the mediastinum and the coronary arteries, including calcified atherosclerotic plaque in the left main and left anterior descending coronary arteries.  Thickening calcification of the aortic valve. Mediastinum/Nodes: No pathologically enlarged mediastinal, internal mammary or hilar lymph nodes. Esophagus is unremarkable in appearance. No axillary lymphadenopathy. Lungs/Pleura: 3 mm right upper lobe pulmonary nodule (axial image 24 of series 5), stable, considered definitively benign. 5 mm subpleural nodule in the periphery of the left upper lobe (axial image 65 of series 5), also stable to slightly decreased in size, considered benign (presumably a subpleural lymph node. No other new suspicious appearing pulmonary nodules or masses are noted. No acute consolidative airspace disease.  No pleural effusions. Small amount of subpleural reticulation is noted in the periphery of the left upper lobe deep to the left breast, presumably post radiation changes. Upper Abdomen: Diffuse low attenuation throughout the visualized hepatic parenchyma, indicative of a background of hepatic steatosis. Small calcified granuloma in the left lobe of the liver incidentally noted. Aortic atherosclerosis. Numerous colonic diverticulae are noted in the transverse colon. Musculoskeletal: Status post left modified radical mastectomy. There are no aggressive appearing lytic or blastic lesions noted in the visualized portions of the skeleton. IMPRESSION: 1. Status post left modified radical mastectomy with no findings to suggest definite metastatic disease in the thorax. 2. Previously noted pulmonary nodules are stable, considered definitively benign. 3. Aortic atherosclerosis, in addition to left main and left anterior descending coronary artery disease. Assessment for potential risk factor modification, dietary therapy or pharmacologic therapy may be warranted, if clinically indicated. 4. There are calcifications of the aortic valve. Echocardiographic correlation for evaluation of potential valvular dysfunction may be warranted if clinically indicated. 5. Hepatic steatosis. 6. Colonic  diverticulosis. Aortic Atherosclerosis (ICD10-I70.0). Electronically Signed   By: Vinnie Langton M.D.   On: 03/29/2021 14:14    Assessment & Plan:   Problem List Items Addressed This Visit     Aortic atherosclerosis ([place])    Chest CT 2022: There is aortic atherosclerosis, as well as atherosclerosis of the great vessels of the mediastinum and the coronary arteries, including calcified atherosclerotic plaque in the left main and left anterior descending coronary arteries.  On ASA Start fish oil Pt declined statins      Coronary atherosclerosis    Chest CT 2022: There is aortic atherosclerosis, as well as atherosclerosis of the great vessels of the mediastinum and the coronary arteries, including calcified atherosclerotic plaque in the left main and left anterior descending coronary arteries.  On ASA Start fish oil Pt declined statins      Relevant Orders   TSH   Lipid panel   Hepatic function panel   Comprehensive metabolic panel   Palpitations - Primary   Relevant Orders   TSH   Urinalysis   CBC with Differential/Platelet   Lipid panel   Hepatic function panel   Comprehensive metabolic panel   Sedimentation rate   EKG 12-Lead   Well adult exam      We discussed age appropriate health related issues, including available/recomended screening tests and vaccinations. Labs were ordered to be later reviewed . All questions were answered. We discussed one or more of the following - seat belt use, use of sunscreen/sun exposure exercise, fall risk reduction, second hand smoke exposure, firearm use and storage, seat belt use, a need for adhering to healthy diet and exercise. Labs were ordered.  All questions were answered. Cologuard 2019, 2023 - refused Declined Shingrix      Other Visit Diagnoses     Bladder neck obstruction       Relevant Orders   PSA         Meds ordered this encounter  Medications   Omega-3 Fatty Acids (FISH OIL) 1000 MG CAPS    Sig: Take  2 capsules (2,000 mg total) by mouth daily.    Dispense:  100 capsule    Refill:  3      Follow-up: Return in about 1 year (around 03/03/2023) for Wellness Exam.  Walker Kehr, MD

## 2022-03-02 NOTE — Assessment & Plan Note (Signed)
   We discussed age appropriate health related issues, including available/recomended screening tests and vaccinations. Labs were ordered to be later reviewed . All questions were answered. We discussed one or more of the following - seat belt use, use of sunscreen/sun exposure exercise, fall risk reduction, second hand smoke exposure, firearm use and storage, seat belt use, a need for adhering to healthy diet and exercise. Labs were ordered.  All questions were answered. Cologuard 2019, 2023 - refused Declined Shingrix

## 2022-03-02 NOTE — Assessment & Plan Note (Signed)
Chest CT 2022: There is aortic atherosclerosis, as well as atherosclerosis of the great vessels of the mediastinum and the coronary arteries, including calcified atherosclerotic plaque in the left main and left anterior descending coronary arteries.  On ASA Start fish oil Pt declined statins

## 2022-03-03 LAB — URINALYSIS
Bilirubin Urine: NEGATIVE
Hgb urine dipstick: NEGATIVE
Leukocytes,Ua: NEGATIVE
Nitrite: NEGATIVE
Specific Gravity, Urine: >=1.03 — AB (ref 1.000–1.030)
Total Protein, Urine: NEGATIVE
Urine Glucose: 100 — AB
Urobilinogen, UA: 0.2 (ref 0.0–1.0)
pH: 5.5 (ref 5.0–8.0)

## 2022-03-06 ENCOUNTER — Encounter: Payer: Self-pay | Admitting: Internal Medicine

## 2022-04-15 NOTE — Progress Notes (Signed)
Patient Care Team: Plotnikov, Evie Lacks, MD as PCP - General (Internal Medicine) Erroll Luna, MD as Consulting Physician (General Surgery) Nicholas Lose, MD as Consulting Physician (Hematology and Oncology) Kyung Rudd, MD as Consulting Physician (Radiation Oncology)  DIAGNOSIS: No diagnosis found.  SUMMARY OF ONCOLOGIC HISTORY: Oncology History  Malignant neoplasm of upper-inner quadrant of left breast in male, estrogen receptor positive ([place])  03/12/2019 Initial Diagnosis   Patient palpated a left breast mass. Diagnostic mammogram and US showed no right breast malignancy and a 3.0cm mass in the upper inner quadrant of the left breast with a promnent left axillary lymph node. Biopsy showed invasive mammary carcinoma in the breast and axilla, grade 2, HER-2 equivocal by IHC (2+), ER/PR+ >95%, Ki67 25%.   07/2019 - 07/2029 Anti-estrogen oral therapy   Tamoxifen   04/02/2020 Surgery   Left mastectomy ([facility]) [phone]): invasive and in situ ductal carcinoma, 2.8cm, clear margins, and 1/2 left axillary lymph nodes positive for carcinoma, 1.1cm.    04/02/2020 Cancer Staging   Staging form: Breast, AJCC 8th Edition - Pathologic stage from 04/02/2020: Stage IB (pT2, pN1a, cM0, G2, ER+, PR+, HER2-) Stage prefix: Initial diagnosis Histologic grading system: 3 grade system   04/13/2020 Oncotype testing   Mammaprint: low risk   05/19/2020 - 07/13/2020 Radiation Therapy   The patient initially received a dose of 50.4 Gy in 28 fractions to the left chest wall and supraclavicular region. This was delivered using a 3-D conformal, 4 field technique. The patient then received a boost to the mastectomy scar. This delivered an additional 10 Gy in 5 fractions using an en face electron field. The total dose was 60.4 Gy.     CHIEF COMPLIANT: Follow-up of left breast cancer   INTERVAL HISTORY: Danny Gutierrez is a 76 y.o. with above-mentioned history of left breast cancer who  underwent a left mastectomy. He presents to the clinic today for a annual follow-up.     ALLERGIES:  is allergic to sulfa antibiotics.  MEDICATIONS:  Current Outpatient Medications  Medication Sig Dispense Refill   acetaminophen (TYLENOL) 500 MG tablet Take 500 mg by mouth every 6 (six) hours as needed.     Ascorbic Acid 500 MG CHEW Chew by mouth.     aspirin EC 81 MG tablet Take 81 mg by mouth daily. Swallow whole.     Cholecalciferol 1000 UNITS tablet Take 1,000 Units by mouth daily.     diphenhydrAMINE (BENADRYL) 12.5 MG/5ML elixir Take 2.5 mLs (6.25 mg total) by mouth every 6 (six) hours as needed. 120 mL 0   dorzolamide-timolol (COSOPT) 22.3-6.8 MG/ML ophthalmic solution INT 1 GTT IN OU BID  6   LUMIGAN 0.01 % SOLN 1 drop at bedtime. 1 drop in each eye at bedtime     Melatonin 10 MG TABS Take by mouth.     Multiple Minerals-Vitamins (CALCIUM-MAGNESIUM-ZINC-D3 PO) Take by mouth.     Multiple Vitamins-Minerals (CENTRUM SILVER ADULT 50+ PO) Take by mouth.     Multiple Vitamins-Minerals (OCUVITE EYE HEALTH FORMULA PO) Take by mouth daily.     Multiple Vitamins-Minerals (ONE-A-DAY PROACTIVE 65+ PO) Take by mouth.     Omega-3 Fatty Acids (FISH OIL) 1000 MG CAPS Take 2 capsules (2,000 mg total) by mouth daily. 100 capsule 3   OVER THE COUNTER MEDICATION Focus Factor     Pollen Extracts (PROSTAT PO) Take by mouth. prostagenix     SUPER B COMPLEX/C PO Take by mouth.     No current facility-administered medications for  this visit.    PHYSICAL EXAMINATION: ECOG PERFORMANCE STATUS: {CHL ONC ECOG PS:[phone]}  There were no vitals filed for this visit. There were no vitals filed for this visit.  BREAST:*** No palpable masses or nodules in either right or left breasts. No palpable axillary supraclavicular or infraclavicular adenopathy no breast tenderness or nipple discharge. (exam performed in the presence of a chaperone)  LABORATORY DATA:  I have reviewed the data as listed     Latest Ref Rng & Units 03/02/2022    3:31 PM 03/26/2021    1:53 PM 02/10/2021   11:11 AM  CMP  Glucose 70 - 99 mg/dL 92   99   BUN 6 - 23 mg/dL 16   18   Creatinine 0.40 - 1.50 mg/dL 0.68  0.70  0.77   Sodium 135 - 145 mEq/L 140   138   Potassium 3.5 - 5.1 mEq/L 3.9   3.9   Chloride 96 - 112 mEq/L 107   104   CO2 19 - 32 mEq/L 26   27   Calcium 8.4 - 10.5 mg/dL 8.7   9.0   Total Protein 6.0 - 8.3 g/dL 6.0 - 8.3 g/dL 6.5    6.5   6.8   Total Bilirubin 0.2 - 1.2 mg/dL 0.2 - 1.2 mg/dL 0.4    0.4   0.6   Alkaline Phos 39 - 117 U/L 39 - 117 U/L 47    47   34   AST 0 - 37 U/L 0 - 37 U/L _0 ALT 0 - 53 U/L 0 - 53 U/L _1 Lab Results  Component Value Date   WBC 7.1 03/02/2022   HGB 14.0 03/02/2022   HCT 43.0 03/02/2022   MCV 83.8 03/02/2022   PLT 162.0 03/02/2022   NEUTROABS 5.2 03/02/2022    ASSESSMENT & PLAN:  No problem-specific Assessment & Plan notes found for this encounter.    No orders of the defined types were placed in this encounter.  The patient has a good understanding of the overall plan. he agrees with it. he will call with any problems that may develop before the next visit here. Total time spent: 30 mins including face to face time and time spent for planning, charting and co-ordination of care   Suzzette Righter, [place] 04/15/22    I Gardiner Coins am scribing for Dr. Lindi Adie  ***

## 2022-04-18 ENCOUNTER — Telehealth: Payer: Self-pay | Admitting: Internal Medicine

## 2022-04-18 ENCOUNTER — Inpatient Hospital Stay: Payer: PPO | Attending: Hematology and Oncology | Admitting: Hematology and Oncology

## 2022-04-18 ENCOUNTER — Other Ambulatory Visit: Payer: Self-pay

## 2022-04-18 VITALS — BP 165/79 | HR 72 | Temp 97.5°F | Resp 18 | Ht 66.0 in | Wt 169.2 lb

## 2022-04-18 DIAGNOSIS — R918 Other nonspecific abnormal finding of lung field: Secondary | ICD-10-CM | POA: Insufficient documentation

## 2022-04-18 DIAGNOSIS — Z923 Personal history of irradiation: Secondary | ICD-10-CM | POA: Diagnosis not present

## 2022-04-18 DIAGNOSIS — Z9012 Acquired absence of left breast and nipple: Secondary | ICD-10-CM | POA: Diagnosis not present

## 2022-04-18 DIAGNOSIS — Z853 Personal history of malignant neoplasm of breast: Secondary | ICD-10-CM | POA: Diagnosis not present

## 2022-04-18 DIAGNOSIS — Z170 Estrogen receptor positive status [ER+]: Secondary | ICD-10-CM | POA: Diagnosis not present

## 2022-04-18 DIAGNOSIS — C50222 Malignant neoplasm of upper-inner quadrant of left male breast: Secondary | ICD-10-CM

## 2022-04-18 MED ORDER — TAMOXIFEN CITRATE 20 MG PO TABS: 20.0000 mg | ORAL_TABLET | Freq: Every day | ORAL | 3 refills | Status: DC

## 2022-04-18 NOTE — Assessment & Plan Note (Signed)
04/02/2020:Left mastectomy ([facility]): invasive and in situ ductal carcinoma, 2.8cm, clear margins, and 1/2 left axillary lymph nodes positive for carcinomawith extranodal extension, 1.1cm. grade 2, HER-2 equivocal by IHC (2+)FISH negative, ER/PR+ >95%, Ki67 25%.  MammaPrint:Luminal type A, low risk (no benefit from chemotherapy)  Treatment plan: 1.adjuvant radiationstarted 05/19/2020(will complete 07/06/2020) 2.Adjuvant antiestrogen therapywith tamoxifen x10 years started 08/04/2020  Tamoxifen toxicities: Other than mild fatigue he is tolerating the treatment fairly well. He had a brief episode of vertigo and was thinking it might be tamoxifen related but it got better and he has not had any further issues with vertigo anymore.  Breast cancer surveillance: 1.  Breast exam 04/18/2022: Benign 2. right breast mammogram 11/23/2020: Benign, I will request the right breast mammogram to be done this year.  CT chest 03/29/2021: Previously noted pulmonary nodules are stable considered benign, hepatic steatosis, postsurgical changes.   Return to clinic in 1 year for follow-up

## 2022-04-18 NOTE — Telephone Encounter (Signed)
LVM for pt to rtn my call to schedule AWV with [facility] call back # [phone] 

## 2022-04-26 ENCOUNTER — Telehealth: Payer: Self-pay | Admitting: Internal Medicine

## 2022-04-26 NOTE — Telephone Encounter (Signed)
N/A unable to leave a message for patient to call back to schedule Medicare Annual Wellness Visit   Last AWV  01/25/18  Please schedule at anytime with [facility] if patient calls the office back.     Any questions, please call me at [phone]

## 2022-04-28 ENCOUNTER — Ambulatory Visit
Admission: RE | Admit: 2022-04-28 | Discharge: 2022-04-28 | Disposition: A | Discharge status: Home / Self Care | Payer: PPO | Source: Ambulatory Visit | Attending: Hematology and Oncology | Admitting: Hematology and Oncology

## 2022-04-28 DIAGNOSIS — Z853 Personal history of malignant neoplasm of breast: Secondary | ICD-10-CM | POA: Diagnosis not present

## 2022-04-28 DIAGNOSIS — Z170 Estrogen receptor positive status [ER+]: Secondary | ICD-10-CM

## 2022-08-25 DIAGNOSIS — H401432 Capsular glaucoma with pseudoexfoliation of lens, bilateral, moderate stage: Secondary | ICD-10-CM | POA: Diagnosis not present

## 2023-02-02 ENCOUNTER — Encounter (INDEPENDENT_AMBULATORY_CARE_PROVIDER_SITE_OTHER): Payer: Self-pay

## 2023-03-02 DIAGNOSIS — H268 Other specified cataract: Secondary | ICD-10-CM | POA: Diagnosis not present

## 2023-03-02 DIAGNOSIS — H52203 Unspecified astigmatism, bilateral: Secondary | ICD-10-CM | POA: Diagnosis not present

## 2023-03-02 DIAGNOSIS — H524 Presbyopia: Secondary | ICD-10-CM | POA: Diagnosis not present

## 2023-03-02 DIAGNOSIS — H2513 Age-related nuclear cataract, bilateral: Secondary | ICD-10-CM | POA: Diagnosis not present

## 2023-03-02 DIAGNOSIS — H02005 Unspecified entropion of left lower eyelid: Secondary | ICD-10-CM | POA: Diagnosis not present

## 2023-03-02 DIAGNOSIS — H401432 Capsular glaucoma with pseudoexfoliation of lens, bilateral, moderate stage: Secondary | ICD-10-CM | POA: Diagnosis not present

## 2023-03-02 DIAGNOSIS — H5203 Hypermetropia, bilateral: Secondary | ICD-10-CM | POA: Diagnosis not present

## 2023-03-06 ENCOUNTER — Telehealth: Payer: Self-pay | Admitting: Internal Medicine

## 2023-03-06 ENCOUNTER — Ambulatory Visit (INDEPENDENT_AMBULATORY_CARE_PROVIDER_SITE_OTHER): Payer: PPO | Admitting: Internal Medicine

## 2023-03-06 ENCOUNTER — Encounter: Payer: Self-pay | Admitting: Internal Medicine

## 2023-03-06 VITALS — BP 110/80 | HR 66 | Temp 98.0°F | Ht 66.0 in | Wt 170.0 lb

## 2023-03-06 DIAGNOSIS — C50222 Malignant neoplasm of upper-inner quadrant of left male breast: Secondary | ICD-10-CM

## 2023-03-06 DIAGNOSIS — R002 Palpitations: Secondary | ICD-10-CM

## 2023-03-06 DIAGNOSIS — I2583 Coronary atherosclerosis due to lipid rich plaque: Secondary | ICD-10-CM | POA: Diagnosis not present

## 2023-03-06 DIAGNOSIS — Z125 Encounter for screening for malignant neoplasm of prostate: Secondary | ICD-10-CM

## 2023-03-06 DIAGNOSIS — Z0001 Encounter for general adult medical examination with abnormal findings: Secondary | ICD-10-CM

## 2023-03-06 DIAGNOSIS — Z170 Estrogen receptor positive status [ER+]: Secondary | ICD-10-CM | POA: Diagnosis not present

## 2023-03-06 DIAGNOSIS — I2510 Atherosclerotic heart disease of native coronary artery without angina pectoris: Secondary | ICD-10-CM

## 2023-03-06 DIAGNOSIS — Z0000 Encounter for general adult medical examination without abnormal findings: Secondary | ICD-10-CM

## 2023-03-06 LAB — LIPID PANEL
Cholesterol: 195 mg/dL (ref 0–200)
HDL: 35.6 mg/dL — ABNORMAL LOW (ref >39.00)
LDL Cholesterol: 125 mg/dL — ABNORMAL HIGH (ref 0–99)
NonHDL: 159.88
Total CHOL/HDL Ratio: 5
Triglycerides: 172 mg/dL — ABNORMAL HIGH (ref 0.0–149.0)
VLDL: 34.4 mg/dL (ref 0.0–40.0)

## 2023-03-06 LAB — HEPATIC FUNCTION PANEL
ALT: 17 U/L (ref 0–53)
AST: 13 U/L (ref 0–37)
Albumin: 3.8 g/dL (ref 3.5–5.2)
Alkaline Phosphatase: 42 U/L (ref 39–117)
Bilirubin, Direct: 0.1 mg/dL (ref 0.0–0.3)
Total Bilirubin: 0.4 mg/dL (ref 0.2–1.2)
Total Protein: 6.6 g/dL (ref 6.0–8.3)

## 2023-03-06 LAB — COMPREHENSIVE METABOLIC PANEL
ALT: 17 U/L (ref 0–53)
AST: 13 U/L (ref 0–37)
Albumin: 3.8 g/dL (ref 3.5–5.2)
Alkaline Phosphatase: 42 U/L (ref 39–117)
BUN: 13 mg/dL (ref 6–23)
CO2: 26 meq/L (ref 19–32)
Calcium: 8.8 mg/dL (ref 8.4–10.5)
Chloride: 105 meq/L (ref 96–112)
Creatinine, Ser: 0.68 mg/dL (ref 0.40–1.50)
GFR: 90.85 mL/min (ref >60.00)
Glucose, Bld: 115 mg/dL — ABNORMAL HIGH (ref 70–99)
Potassium: 3.9 meq/L (ref 3.5–5.1)
Sodium: 139 meq/L (ref 135–145)
Total Bilirubin: 0.4 mg/dL (ref 0.2–1.2)
Total Protein: 6.6 g/dL (ref 6.0–8.3)

## 2023-03-06 LAB — URINALYSIS
Bilirubin Urine: NEGATIVE
Hgb urine dipstick: NEGATIVE
Leukocytes,Ua: NEGATIVE
Nitrite: NEGATIVE
Specific Gravity, Urine: >=1.03 — AB (ref 1.000–1.030)
Total Protein, Urine: NEGATIVE
Urine Glucose: 250 — AB
Urobilinogen, UA: 0.2 (ref 0.0–1.0)
pH: 5 (ref 5.0–8.0)

## 2023-03-06 LAB — CBC WITH DIFFERENTIAL/PLATELET
Basophils Absolute: 0 10*3/uL (ref 0.0–0.1)
Basophils Relative: 0.5 % (ref 0.0–3.0)
Eosinophils Absolute: 0.1 10*3/uL (ref 0.0–0.7)
Eosinophils Relative: 2.4 % (ref 0.0–5.0)
HCT: 44.6 % (ref 39.0–52.0)
Hemoglobin: 14.4 g/dL (ref 13.0–17.0)
Lymphocytes Relative: 20.7 % (ref 12.0–46.0)
Lymphs Abs: 1.1 10*3/uL (ref 0.7–4.0)
MCHC: 32.2 g/dL (ref 30.0–36.0)
MCV: 85.7 fl (ref 78.0–100.0)
Monocytes Absolute: 0.5 10*3/uL (ref 0.1–1.0)
Monocytes Relative: 8.8 % (ref 3.0–12.0)
Neutro Abs: 3.7 10*3/uL (ref 1.4–7.7)
Neutrophils Relative %: 67.6 % (ref 43.0–77.0)
Platelets: 165 10*3/uL (ref 150.0–400.0)
RBC: 5.2 Mil/uL (ref 4.22–5.81)
RDW: 14.8 % (ref 11.5–15.5)
WBC: 5.4 10*3/uL (ref 4.0–10.5)

## 2023-03-06 LAB — PSA: PSA: 2.01 ng/mL (ref 0.10–4.00)

## 2023-03-06 LAB — SEDIMENTATION RATE: Sed Rate: 23 mm/h — ABNORMAL HIGH (ref 0–20)

## 2023-03-06 LAB — TSH: TSH: 4.78 u[IU]/mL (ref 0.35–5.50)

## 2023-03-06 NOTE — Telephone Encounter (Signed)
Patient asked if lab results be mailed to his house.

## 2023-03-06 NOTE — Assessment & Plan Note (Signed)
Pt declined CXR

## 2023-03-06 NOTE — Assessment & Plan Note (Signed)
EKG - S brady

## 2023-03-06 NOTE — Assessment & Plan Note (Signed)
We discussed age appropriate health related issues, including available/recomended screening tests and vaccinations. Labs were ordered to be later reviewed . All questions were answered. We discussed one or more of the following - seat belt use, use of sunscreen/sun exposure exercise, fall risk reduction, second hand smoke exposure, firearm use and storage, seat belt use, a need for adhering to healthy diet and exercise. Labs were ordered.  All questions were answered. Cologuard 2019, 2023 - refused Declined Shingrix

## 2023-03-06 NOTE — Progress Notes (Signed)
Subjective:  Patient ID: Danny Gutierrez, male    DOB: 1946-04-01  Age: 77 y.o. MRN: [id number]  CC: Annual Exam   HPI Finnbar Saiz presents for a well exam  Outpatient Medications Prior to Visit  Medication Sig Dispense Refill   acetaminophen (TYLENOL) 500 MG tablet Take 500 mg by mouth every 6 (six) hours as needed.     Ascorbic Acid 500 MG CHEW Chew by mouth.     aspirin EC 81 MG tablet Take 81 mg by mouth daily. Swallow whole.     Cholecalciferol 1000 UNITS tablet Take 1,000 Units by mouth daily.     diphenhydrAMINE (BENADRYL) 12.5 MG/5ML elixir Take 2.5 mLs (6.25 mg total) by mouth every 6 (six) hours as needed. 120 mL 0   dorzolamide-timolol (COSOPT) 22.3-6.8 MG/ML ophthalmic solution INT 1 GTT IN OU BID  6   LUMIGAN 0.01 % SOLN 1 drop at bedtime. 1 drop in each eye at bedtime     Melatonin 10 MG TABS Take by mouth.     Multiple Minerals-Vitamins (CALCIUM-MAGNESIUM-ZINC-D3 PO) Take by mouth.     Multiple Vitamins-Minerals (CENTRUM SILVER ADULT 50+ PO) Take by mouth.     Multiple Vitamins-Minerals (OCUVITE EYE HEALTH FORMULA PO) Take by mouth daily.     Multiple Vitamins-Minerals (ONE-A-DAY PROACTIVE 65+ PO) Take by mouth.     Omega-3 Fatty Acids (FISH OIL) 1000 MG CAPS Take 2 capsules (2,000 mg total) by mouth daily. 100 capsule 3   OVER THE COUNTER MEDICATION Focus Factor     Pollen Extracts (PROSTAT PO) Take by mouth. prostagenix     SUPER B COMPLEX/C PO Take by mouth.     tamoxifen (NOLVADEX) 20 MG tablet Take 1 tablet (20 mg total) by mouth daily. 90 tablet 3   No facility-administered medications prior to visit.    ROS: Review of Systems  Constitutional:  Negative for appetite change, fatigue and unexpected weight change.  HENT:  Negative for congestion, nosebleeds, sneezing, sore throat and trouble swallowing.   Eyes:  Negative for itching and visual disturbance.  Respiratory:  Negative for cough.   Cardiovascular:  Negative for chest pain,  palpitations and leg swelling.  Gastrointestinal:  Negative for abdominal distention, blood in stool, diarrhea and nausea.  Genitourinary:  Negative for frequency, hematuria and urgency.  Musculoskeletal:  Negative for back pain, gait problem, joint swelling and neck pain.  Skin:  Negative for rash.  Neurological:  Negative for dizziness, tremors, speech difficulty and weakness.  Psychiatric/Behavioral:  Negative for agitation, dysphoric mood, sleep disturbance and suicidal ideas. The patient is not nervous/anxious.     Objective:  BP 110/80 (BP Location: Right Arm, Patient Position: Sitting, Cuff Size: Large)   Pulse 66   Temp 98 F (36.7 C) (Oral)   Ht 5\' 6"  (1.676 m)   Wt 170 lb (77.1 kg)   SpO2 97%   BMI 27.44 kg/m   BP Readings from Last 3 Encounters:  03/06/23 110/80  04/18/22 (!) 165/79  03/02/22 120/82    Wt Readings from Last 3 Encounters:  03/06/23 170 lb (77.1 kg)  04/18/22 169 lb 3.2 oz (76.7 kg)  03/02/22 166 lb 3.2 oz (75.4 kg)    Physical Exam Constitutional:      General: He is not in acute distress.    Appearance: He is well-developed. He is obese.     Comments: NAD  Eyes:     Conjunctiva/sclera: Conjunctivae normal.     Pupils: Pupils are equal, round, and reactive  to light.  Neck:     Thyroid: No thyromegaly.     Vascular: No JVD.  Cardiovascular:     Rate and Rhythm: Normal rate and regular rhythm.     Heart sounds: Normal heart sounds. No murmur heard.    No friction rub. No gallop.  Pulmonary:     Effort: Pulmonary effort is normal. No respiratory distress.     Breath sounds: Normal breath sounds. No wheezing or rales.  Chest:     Chest wall: No tenderness.  Abdominal:     General: Bowel sounds are normal. There is no distension.     Palpations: Abdomen is soft. There is no mass.     Tenderness: There is no abdominal tenderness. There is no guarding or rebound.  Musculoskeletal:        General: No tenderness. Normal range of motion.      Cervical back: Normal range of motion.     Right lower leg: No edema.     Left lower leg: No edema.  Lymphadenopathy:     Cervical: No cervical adenopathy.  Skin:    General: Skin is warm and dry.     Findings: No rash.  Neurological:     Mental Status: He is alert and oriented to person, place, and time.     Cranial Nerves: No cranial nerve deficit.     Motor: No abnormal muscle tone.     Coordination: Coordination normal.     Gait: Gait normal.     Deep Tendon Reflexes: Reflexes are normal and symmetric.  Psychiatric:        Behavior: Behavior normal.        Thought Content: Thought content normal.        Judgment: Judgment normal.   Pt refused a rectal exam  Procedure: EKG Indication: chest pain Impression: S brady. VR 61. No acute changes.   Lab Results  Component Value Date   WBC 7.1 03/02/2022   HGB 14.0 03/02/2022   HCT 43.0 03/02/2022   PLT 162.0 03/02/2022   GLUCOSE 92 03/02/2022   CHOL 181 03/02/2022   TRIG 140.0 03/02/2022   HDL 39.70 03/02/2022   LDLDIRECT 165.0 02/05/2019   LDLCALC 114 (H) 03/02/2022   ALT 15 03/02/2022   ALT 15 03/02/2022   AST 12 03/02/2022   AST 12 03/02/2022   NA 140 03/02/2022   K 3.9 03/02/2022   CL 107 03/02/2022   CREATININE 0.68 03/02/2022   BUN 16 03/02/2022   CO2 26 03/02/2022   TSH 3.82 03/02/2022   PSA 2.48 03/02/2022    MM DIAG BREAST TOMO UNI RIGHT  Result Date: 04/28/2022 CLINICAL DATA:  77 year old male with history of left breast cancer status post mastectomy presenting for surveillance of the right breast. EXAM: DIGITAL DIAGNOSTIC UNILATERAL RIGHT MAMMOGRAM WITH TOMOSYNTHESIS TECHNIQUE: Right digital diagnostic mammography and breast tomosynthesis was performed. COMPARISON:  Previous exam(s). ACR Breast Density Category a: The breast tissue is almost entirely fatty. FINDINGS: Right breast: No suspicious mass, distortion, or microcalcifications are identified to suggest presence of malignancy. IMPRESSION: No  mammographic evidence of malignancy in the right breast. RECOMMENDATION: Consider annual screening mammography given patient's personal history of left breast cancer. I have discussed the findings and recommendations with the patient. If applicable, a reminder letter will be sent to the patient regarding the next appointment. BI-RADS CATEGORY  1: Negative. Electronically Signed   By: Emmaline Kluver M.D.   On: 04/28/2022 11:37   Assessment & Plan:  Problem List Items Addressed This Visit     Well adult exam      We discussed age appropriate health related issues, including available/recomended screening tests and vaccinations. Labs were ordered to be later reviewed . All questions were answered. We discussed one or more of the following - seat belt use, use of sunscreen/sun exposure exercise, fall risk reduction, second hand smoke exposure, firearm use and storage, seat belt use, a need for adhering to healthy diet and exercise. Labs were ordered.  All questions were answered. Cologuard 2019, 2023 - refused Declined Shingrix      Relevant Orders   TSH   Urinalysis   CBC with Differential/Platelet   Lipid panel   Hepatic function panel   Comprehensive metabolic panel   PSA   Sedimentation rate   Palpitations - Primary   Relevant Orders   EKG 12-Lead   Sedimentation rate   Malignant neoplasm of upper-inner quadrant of left breast in male, estrogen receptor positive (HCC)    Pt declined CXR      Coronary atherosclerosis    EKG - S brady      Relevant Orders   Lipid panel      No orders of the defined types were placed in this encounter.     Follow-up: Return in about 1 year (around 03/05/2024) for Wellness Exam.  Sonda Primes, MD

## 2023-03-22 ENCOUNTER — Other Ambulatory Visit: Payer: Self-pay | Admitting: Hematology and Oncology

## 2023-03-22 DIAGNOSIS — Z1231 Encounter for screening mammogram for malignant neoplasm of breast: Secondary | ICD-10-CM

## 2023-04-19 ENCOUNTER — Inpatient Hospital Stay: Payer: PPO | Attending: Hematology and Oncology | Admitting: Hematology and Oncology

## 2023-04-19 VITALS — BP 168/95 | HR 80 | Temp 97.9°F | Resp 18 | Ht 66.0 in | Wt 171.6 lb

## 2023-04-19 DIAGNOSIS — Z1721 Progesterone receptor positive status: Secondary | ICD-10-CM | POA: Diagnosis not present

## 2023-04-19 DIAGNOSIS — Z170 Estrogen receptor positive status [ER+]: Secondary | ICD-10-CM | POA: Diagnosis not present

## 2023-04-19 DIAGNOSIS — Z923 Personal history of irradiation: Secondary | ICD-10-CM | POA: Insufficient documentation

## 2023-04-19 DIAGNOSIS — Z79810 Long term (current) use of selective estrogen receptor modulators (SERMs): Secondary | ICD-10-CM | POA: Insufficient documentation

## 2023-04-19 DIAGNOSIS — C50222 Malignant neoplasm of upper-inner quadrant of left male breast: Secondary | ICD-10-CM | POA: Insufficient documentation

## 2023-04-19 DIAGNOSIS — Z9012 Acquired absence of left breast and nipple: Secondary | ICD-10-CM | POA: Diagnosis not present

## 2023-04-19 MED ORDER — TAMOXIFEN CITRATE 20 MG PO TABS: 20.0000 mg | ORAL_TABLET | Freq: Every day | ORAL | 3 refills | Status: DC

## 2023-04-19 NOTE — Progress Notes (Signed)
Patient Care Team: Plotnikov, Georgina Quint, MD as PCP - General (Internal Medicine) Harriette Bouillon, MD as Consulting Physician (General Surgery) Serena Croissant, MD as Consulting Physician (Hematology and Oncology) Dorothy Puffer, MD as Consulting Physician (Radiation Oncology)  DIAGNOSIS:  Encounter Diagnosis  Name Primary?   Malignant neoplasm of upper-inner quadrant of left breast in male, estrogen receptor positive (HCC) Yes    SUMMARY OF ONCOLOGIC HISTORY: Oncology History  Malignant neoplasm of upper-inner quadrant of left breast in male, estrogen receptor positive (HCC)  03/12/2019 Initial Diagnosis   Patient palpated a left breast mass. Diagnostic mammogram and US showed no right breast malignancy and a 3.0cm mass in the upper inner quadrant of the left breast with a promnent left axillary lymph node. Biopsy showed invasive mammary carcinoma in the breast and axilla, grade 2, HER-2 equivocal by IHC (2+), ER/PR+ >95%, Ki67 25%.   07/2019 - 07/2029 Anti-estrogen oral therapy   Tamoxifen   04/02/2020 Surgery   Left mastectomy ([facility]) [phone]): invasive and in situ ductal carcinoma, 2.8cm, clear margins, and 1/2 left axillary lymph nodes positive for carcinoma, 1.1cm.    04/02/2020 Cancer Staging   Staging form: Breast, AJCC 8th Edition - Pathologic stage from 04/02/2020: Stage IB (pT2, pN1a, cM0, G2, ER+, PR+, HER2-) Stage prefix: Initial diagnosis Histologic grading system: 3 grade system   04/13/2020 Oncotype testing   Mammaprint: low risk   05/19/2020 - 07/13/2020 Radiation Therapy   The patient initially received a dose of 50.4 Gy in 28 fractions to the left chest wall and supraclavicular region. This was delivered using a 3-D conformal, 4 field technique. The patient then received a boost to the mastectomy scar. This delivered an additional 10 Gy in 5 fractions using an en face electron field. The total dose was 60.4 Gy.     CHIEF COMPLIANT: F/u on  Tamoxifen  History of Present Illness   The patient, a male with a history of breast cancer treated with mastectomy, radiation, and ongoing tamoxifen therapy, presents for a routine follow-up. He reports no specific health complaints and feels generally well. He had an annual physical with his primary care physician one and a half months ago, which included blood tests and an EKG. He has been on tamoxifen for over two years, with no reported side effects. He expresses understanding of the need for tamoxifen as a hormonal therapy drug and is compliant with its use. He also reports keeping up with his mammogram appointments, with the next one scheduled for November. He self-checks his remaining breast and has not noticed anything unusual.         ALLERGIES:  is allergic to sulfa antibiotics.  MEDICATIONS:  Current Outpatient Medications  Medication Sig Dispense Refill   acetaminophen (TYLENOL) 500 MG tablet Take 500 mg by mouth every 6 (six) hours as needed.     Ascorbic Acid 500 MG CHEW Chew by mouth.     aspirin EC 81 MG tablet Take 81 mg by mouth daily. Swallow whole.     Cholecalciferol 1000 UNITS tablet Take 1,000 Units by mouth daily.     diphenhydrAMINE (BENADRYL) 12.5 MG/5ML elixir Take 2.5 mLs (6.25 mg total) by mouth every 6 (six) hours as needed. 120 mL 0   dorzolamide-timolol (COSOPT) 22.3-6.8 MG/ML ophthalmic solution INT 1 GTT IN OU BID  6   LUMIGAN 0.01 % SOLN 1 drop at bedtime. 1 drop in each eye at bedtime     Melatonin 10 MG TABS Take by mouth.  Multiple Minerals-Vitamins (CALCIUM-MAGNESIUM-ZINC-D3 PO) Take by mouth.     Multiple Vitamins-Minerals (CENTRUM SILVER ADULT 50+ PO) Take by mouth.     Multiple Vitamins-Minerals (OCUVITE EYE HEALTH FORMULA PO) Take by mouth daily.     Multiple Vitamins-Minerals (ONE-A-DAY PROACTIVE 65+ PO) Take by mouth.     Omega-3 Fatty Acids (FISH OIL) 1000 MG CAPS Take 2 capsules (2,000 mg total) by mouth daily. 100 capsule 3   OVER THE  COUNTER MEDICATION Focus Factor     Pollen Extracts (PROSTAT PO) Take by mouth. prostagenix     SUPER B COMPLEX/C PO Take by mouth.     tamoxifen (NOLVADEX) 20 MG tablet Take 1 tablet (20 mg total) by mouth daily. 90 tablet 3   No current facility-administered medications for this visit.    PHYSICAL EXAMINATION: ECOG PERFORMANCE STATUS: 1 - Symptomatic but completely ambulatory  Vitals:   04/19/23 1033 04/19/23 1034  BP: (!) 180/98 (!) 168/95  Pulse: 80   Resp: 18   Temp: 97.9 F (36.6 C)   SpO2: 99%    Filed Weights   04/19/23 1033  Weight: 171 lb 9.6 oz (77.8 kg)    Physical Exam   MEASUREMENTS: WT- lost about twenty five pounds BREAST: No abnormalities detected upon inspection and palpation of the second breast.      (exam performed in the presence of a chaperone)  LABORATORY DATA:  I have reviewed the data as listed    Latest Ref Rng & Units 03/06/2023   10:53 AM 03/02/2022    3:31 PM 03/26/2021    1:53 PM  CMP  Glucose 70 - 99 mg/dL 161  92    BUN 6 - 23 mg/dL 13  16    Creatinine 0.96 - 1.50 mg/dL 0.45  4.09  8.11   Sodium 135 - 145 mEq/L 139  140    Potassium 3.5 - 5.1 mEq/L 3.9  3.9    Chloride 96 - 112 mEq/L 105  107    CO2 19 - 32 mEq/L 26  26    Calcium 8.4 - 10.5 mg/dL 8.8  8.7    Total Protein 6.0 - 8.3 g/dL 6.0 - 8.3 g/dL 6.6    6.6  6.5    6.5    Total Bilirubin 0.2 - 1.2 mg/dL 0.2 - 1.2 mg/dL 0.4    0.4  0.4    0.4    Alkaline Phos 39 - 117 U/L 39 - 117 U/L 42    42  47    47    AST 0 - 37 U/L 0 - 37 U/L 13    13  12    12     ALT 0 - 53 U/L 0 - 53 U/L 17    17  15    15       Lab Results  Component Value Date   WBC 5.4 03/06/2023   HGB 14.4 03/06/2023   HCT 44.6 03/06/2023   MCV 85.7 03/06/2023   PLT 165.0 03/06/2023   NEUTROABS 3.7 03/06/2023    ASSESSMENT & PLAN:  Malignant neoplasm of upper-inner 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 grade 2, HER-2 equivocal by IHC (2+) FISH negative, ER/PR+ >95%, Ki67 25%.   MammaPrint: Luminal type A, low risk (no benefit from chemotherapy)   Treatment plan: 1. adjuvant radiation  started 05/19/2020 (will complete 07/06/2020) 2. Adjuvant antiestrogen therapy with tamoxifen x10 years started 08/04/2020   Tamoxifen toxicities: Other than mild fatigue he is tolerating the treatment fairly well. He had a brief episode of vertigo and was thinking it might be tamoxifen related but it got better and he has not had any further issues with vertigo anymore.   Breast cancer surveillance: 1.  Breast exam 04/19/2023: Benign 2. right breast mammogram 04/28/2022: Benign.   CT chest 03/29/2021: Previously noted pulmonary nodules are stable considered benign, hepatic steatosis, postsurgical changes.    Return to clinic in 1 year for follow-up  No orders of the defined types were placed in this encounter.  The patient has a good understanding of the overall plan. he agrees with it. he will call with any problems that may develop before the next visit here. Total time spent: 30 mins including face to face time and time spent for planning, charting and co-ordination of care   Tamsen Meek, MD 04/19/23

## 2023-04-19 NOTE — Assessment & Plan Note (Signed)
04/02/2020:Left mastectomy ([facility]): invasive and in situ ductal carcinoma, 2.8cm, clear margins, and 1/2 left axillary lymph nodes positive for carcinoma with extranodal extension, 1.1cm. grade 2, HER-2 equivocal by IHC (2+) FISH negative, ER/PR+ >95%, Ki67 25%.   MammaPrint: Luminal type A, low risk (no benefit from chemotherapy)   Treatment plan: 1. adjuvant radiation started 05/19/2020 (will complete 07/06/2020) 2. Adjuvant antiestrogen therapy with tamoxifen x10 years started 08/04/2020   Tamoxifen toxicities: Other than mild fatigue he is tolerating the treatment fairly well. He had a brief episode of vertigo and was thinking it might be tamoxifen related but it got better and he has not had any further issues with vertigo anymore.   Breast cancer surveillance: 1.  Breast exam 04/19/2023: Benign 2. right breast mammogram 04/28/2022: Benign.   CT chest 03/29/2021: Previously noted pulmonary nodules are stable considered benign, hepatic steatosis, postsurgical changes.    Return to clinic in 1 year for follow-up

## 2023-05-01 ENCOUNTER — Ambulatory Visit: Payer: PPO

## 2023-05-01 ENCOUNTER — Ambulatory Visit
Admission: RE | Admit: 2023-05-01 | Discharge: 2023-05-01 | Disposition: A | Discharge status: Home / Self Care | Payer: PPO | Source: Ambulatory Visit | Attending: Hematology and Oncology | Admitting: Hematology and Oncology

## 2023-05-01 DIAGNOSIS — Z1231 Encounter for screening mammogram for malignant neoplasm of breast: Secondary | ICD-10-CM

## 2023-07-06 DIAGNOSIS — H2513 Age-related nuclear cataract, bilateral: Secondary | ICD-10-CM | POA: Diagnosis not present

## 2023-07-06 DIAGNOSIS — H02005 Unspecified entropion of left lower eyelid: Secondary | ICD-10-CM | POA: Diagnosis not present

## 2023-07-06 DIAGNOSIS — H401432 Capsular glaucoma with pseudoexfoliation of lens, bilateral, moderate stage: Secondary | ICD-10-CM | POA: Diagnosis not present

## 2023-07-06 DIAGNOSIS — H268 Other specified cataract: Secondary | ICD-10-CM | POA: Diagnosis not present

## 2023-11-09 DIAGNOSIS — H401432 Capsular glaucoma with pseudoexfoliation of lens, bilateral, moderate stage: Secondary | ICD-10-CM | POA: Diagnosis not present

## 2023-11-09 DIAGNOSIS — H268 Other specified cataract: Secondary | ICD-10-CM | POA: Diagnosis not present

## 2023-11-09 DIAGNOSIS — H02005 Unspecified entropion of left lower eyelid: Secondary | ICD-10-CM | POA: Diagnosis not present

## 2023-11-09 DIAGNOSIS — H5203 Hypermetropia, bilateral: Secondary | ICD-10-CM | POA: Diagnosis not present

## 2023-11-09 DIAGNOSIS — H52203 Unspecified astigmatism, bilateral: Secondary | ICD-10-CM | POA: Diagnosis not present

## 2023-11-09 DIAGNOSIS — H524 Presbyopia: Secondary | ICD-10-CM | POA: Diagnosis not present

## 2023-11-09 DIAGNOSIS — H2513 Age-related nuclear cataract, bilateral: Secondary | ICD-10-CM | POA: Diagnosis not present

## 2024-03-06 ENCOUNTER — Ambulatory Visit (INDEPENDENT_AMBULATORY_CARE_PROVIDER_SITE_OTHER): Payer: PPO | Admitting: Internal Medicine

## 2024-03-06 ENCOUNTER — Ambulatory Visit (INDEPENDENT_AMBULATORY_CARE_PROVIDER_SITE_OTHER)

## 2024-03-06 ENCOUNTER — Encounter: Payer: Self-pay | Admitting: Internal Medicine

## 2024-03-06 VITALS — BP 122/88 | HR 74 | Temp 98.6°F | Ht 66.0 in | Wt 159.2 lb

## 2024-03-06 VITALS — BP 122/88 | HR 74 | Ht 66.0 in | Wt 159.0 lb

## 2024-03-06 DIAGNOSIS — I2583 Coronary atherosclerosis due to lipid rich plaque: Secondary | ICD-10-CM

## 2024-03-06 DIAGNOSIS — Z0001 Encounter for general adult medical examination with abnormal findings: Secondary | ICD-10-CM

## 2024-03-06 DIAGNOSIS — Z1159 Encounter for screening for other viral diseases: Secondary | ICD-10-CM | POA: Diagnosis not present

## 2024-03-06 DIAGNOSIS — N320 Bladder-neck obstruction: Secondary | ICD-10-CM | POA: Diagnosis not present

## 2024-03-06 DIAGNOSIS — Z0000 Encounter for general adult medical examination without abnormal findings: Secondary | ICD-10-CM | POA: Diagnosis not present

## 2024-03-06 DIAGNOSIS — R030 Elevated blood-pressure reading, without diagnosis of hypertension: Secondary | ICD-10-CM | POA: Diagnosis not present

## 2024-03-06 DIAGNOSIS — R002 Palpitations: Secondary | ICD-10-CM

## 2024-03-06 DIAGNOSIS — Z13220 Encounter for screening for lipoid disorders: Secondary | ICD-10-CM

## 2024-03-06 LAB — URINALYSIS, ROUTINE W REFLEX MICROSCOPIC
Hgb urine dipstick: NEGATIVE
Leukocytes,Ua: NEGATIVE
Nitrite: NEGATIVE
Specific Gravity, Urine: >=1.03 — AB (ref 1.000–1.030)
Total Protein, Urine: 100 — AB
Urine Glucose: 100 — AB
Urobilinogen, UA: 1 (ref 0.0–1.0)
pH: 5.5 (ref 5.0–8.0)

## 2024-03-06 LAB — COMPREHENSIVE METABOLIC PANEL WITH GFR
ALT: 56 U/L — ABNORMAL HIGH (ref 0–53)
AST: 38 U/L — ABNORMAL HIGH (ref 0–37)
Albumin: 4 g/dL (ref 3.5–5.2)
Alkaline Phosphatase: 37 U/L — ABNORMAL LOW (ref 39–117)
BUN: 14 mg/dL (ref 6–23)
CO2: 24 meq/L (ref 19–32)
Calcium: 8.8 mg/dL (ref 8.4–10.5)
Chloride: 103 meq/L (ref 96–112)
Creatinine, Ser: 0.7 mg/dL (ref 0.40–1.50)
GFR: 89.43 mL/min (ref >60.00)
Glucose, Bld: 109 mg/dL — ABNORMAL HIGH (ref 70–99)
Potassium: 3.7 meq/L (ref 3.5–5.1)
Sodium: 139 meq/L (ref 135–145)
Total Bilirubin: 0.4 mg/dL (ref 0.2–1.2)
Total Protein: 6.8 g/dL (ref 6.0–8.3)

## 2024-03-06 LAB — LIPID PANEL
Cholesterol: 161 mg/dL (ref 0–200)
HDL: 27.8 mg/dL — ABNORMAL LOW (ref >39.00)
LDL Cholesterol: 95 mg/dL (ref 0–99)
NonHDL: 133.13
Total CHOL/HDL Ratio: 6
Triglycerides: 190 mg/dL — ABNORMAL HIGH (ref 0.0–149.0)
VLDL: 38 mg/dL (ref 0.0–40.0)

## 2024-03-06 LAB — CBC WITH DIFFERENTIAL/PLATELET
Basophils Absolute: 0 K/uL (ref 0.0–0.1)
Basophils Relative: 0.5 % (ref 0.0–3.0)
Eosinophils Absolute: 0.1 K/uL (ref 0.0–0.7)
Eosinophils Relative: 1.7 % (ref 0.0–5.0)
HCT: 42.4 % (ref 39.0–52.0)
Hemoglobin: 14.1 g/dL (ref 13.0–17.0)
Lymphocytes Relative: 24.3 % (ref 12.0–46.0)
Lymphs Abs: 0.9 K/uL (ref 0.7–4.0)
MCHC: 33.3 g/dL (ref 30.0–36.0)
MCV: 80.4 fl (ref 78.0–100.0)
Monocytes Absolute: 0.4 K/uL (ref 0.1–1.0)
Monocytes Relative: 11.3 % (ref 3.0–12.0)
Neutro Abs: 2.4 K/uL (ref 1.4–7.7)
Neutrophils Relative %: 62.2 % (ref 43.0–77.0)
Platelets: 148 K/uL — ABNORMAL LOW (ref 150.0–400.0)
RBC: 5.27 Mil/uL (ref 4.22–5.81)
RDW: 15.2 % (ref 11.5–15.5)
WBC: 3.8 K/uL — ABNORMAL LOW (ref 4.0–10.5)

## 2024-03-06 LAB — SEDIMENTATION RATE: Sed Rate: 22 mm/h — ABNORMAL HIGH (ref 0–20)

## 2024-03-06 LAB — PSA: PSA: 3.02 ng/mL (ref 0.10–4.00)

## 2024-03-06 LAB — TSH: TSH: 4.48 u[IU]/mL (ref 0.35–5.50)

## 2024-03-06 NOTE — Assessment & Plan Note (Signed)
 On ASA Start fish oil  Pt declined statins EKG - S brady

## 2024-03-06 NOTE — Progress Notes (Signed)
 Subjective:  Patient ID: Danny Gutierrez, male    DOB: Nov 07, 1945  Age: 78 y.o. MRN: [id number]  CC: Annual Exam   HPI Danny Gutierrez presents for a well exam  Outpatient Medications Prior to Visit  Medication Sig Dispense Refill   acetaminophen  (TYLENOL ) 500 MG tablet Take 500 mg by mouth every 6 (six) hours as needed.     Ascorbic Acid 500 MG CHEW Chew by mouth.     aspirin EC 81 MG tablet Take 81 mg by mouth daily. Swallow whole.     Cholecalciferol 1000 UNITS tablet Take 1,000 Units by mouth daily.     diphenhydrAMINE  (BENADRYL ) 12.5 MG/5ML elixir Take 2.5 mLs (6.25 mg total) by mouth every 6 (six) hours as needed. 120 mL 0   dorzolamide-timolol (COSOPT) 22.3-6.8 MG/ML ophthalmic solution INT 1 GTT IN OU BID  6   LUMIGAN 0.01 % SOLN 1 drop at bedtime. 1 drop in each eye at bedtime     Melatonin 10 MG TABS Take by mouth.     Multiple Minerals-Vitamins (CALCIUM-MAGNESIUM-ZINC-D3 PO) Take by mouth.     Multiple Vitamins-Minerals (CENTRUM SILVER ADULT 50+ PO) Take by mouth.     Multiple Vitamins-Minerals (OCUVITE EYE HEALTH FORMULA PO) Take by mouth daily.     Multiple Vitamins-Minerals (ONE-A-DAY PROACTIVE 65+ PO) Take by mouth.     Omega-3 Fatty Acids (FISH OIL ) 1000 MG CAPS Take 2 capsules (2,000 mg total) by mouth daily. 100 capsule 3   OVER THE COUNTER MEDICATION Focus Factor     Pollen Extracts (PROSTAT PO) Take by mouth. prostagenix     SUPER B COMPLEX/C PO Take by mouth.     tamoxifen  (NOLVADEX ) 20 MG tablet Take 1 tablet (20 mg total) by mouth daily. 90 tablet 3   No facility-administered medications prior to visit.    ROS: Review of Systems  Constitutional:  Negative for appetite change, fatigue and unexpected weight change.  HENT:  Negative for congestion, nosebleeds, sneezing, sore throat and trouble swallowing.   Eyes:  Negative for itching and visual disturbance.  Respiratory:  Negative for cough.   Cardiovascular:  Negative for chest pain,  palpitations and leg swelling.  Gastrointestinal:  Negative for abdominal distention, blood in stool, diarrhea and nausea.  Genitourinary:  Negative for frequency and hematuria.  Musculoskeletal:  Negative for back pain, gait problem, joint swelling and neck pain.  Skin:  Negative for rash.  Neurological:  Negative for dizziness, tremors, speech difficulty and weakness.  Psychiatric/Behavioral:  Negative for agitation, dysphoric mood, sleep disturbance and suicidal ideas. The patient is not nervous/anxious.     Objective:  BP 122/88   Pulse 74   Temp 98.6 F (37 C) (Oral)   Ht 5' 6 (1.676 m)   Wt 159 lb 3.2 oz (72.2 kg)   SpO2 97%   BMI 25.70 kg/m   BP Readings from Last 3 Encounters:  03/06/24 122/88  04/19/23 (!) 168/95  03/06/23 110/80    Wt Readings from Last 3 Encounters:  03/06/24 159 lb 3.2 oz (72.2 kg)  04/19/23 171 lb 9.6 oz (77.8 kg)  03/06/23 170 lb (77.1 kg)    Physical Exam Constitutional:      General: He is not in acute distress.    Appearance: He is well-developed. He is obese.     Comments: NAD  Eyes:     Conjunctiva/sclera: Conjunctivae normal.     Pupils: Pupils are equal, round, and reactive to light.  Neck:     Thyroid :  No thyromegaly.     Vascular: No JVD.  Cardiovascular:     Rate and Rhythm: Normal rate and regular rhythm.     Heart sounds: Normal heart sounds. No murmur heard.    No friction rub. No gallop.  Pulmonary:     Effort: Pulmonary effort is normal. No respiratory distress.     Breath sounds: Normal breath sounds. No wheezing or rales.  Chest:     Chest wall: No tenderness.  Abdominal:     General: Bowel sounds are normal. There is no distension.     Palpations: Abdomen is soft. There is no mass.     Tenderness: There is no abdominal tenderness. There is no guarding or rebound.  Musculoskeletal:        General: No tenderness. Normal range of motion.     Cervical back: Normal range of motion.     Right lower leg: No edema.      Left lower leg: No edema.  Lymphadenopathy:     Cervical: No cervical adenopathy.  Skin:    General: Skin is warm and dry.     Findings: No rash.  Neurological:     Mental Status: He is alert and oriented to person, place, and time.     Cranial Nerves: No cranial nerve deficit.     Motor: No abnormal muscle tone.     Coordination: Coordination normal.     Gait: Gait normal.     Deep Tendon Reflexes: Reflexes are normal and symmetric.  Psychiatric:        Behavior: Behavior normal.        Thought Content: Thought content normal.        Judgment: Judgment normal.   Pt declined rectal exam  Procedure: EKG Indication: palpitations Impression: S brady. No acute changes.   Lab Results  Component Value Date   WBC 5.4 03/06/2023   HGB 14.4 03/06/2023   HCT 44.6 03/06/2023   PLT 165.0 03/06/2023   GLUCOSE 115 (H) 03/06/2023   CHOL 195 03/06/2023   TRIG 172.0 (H) 03/06/2023   HDL 35.60 (L) 03/06/2023   LDLDIRECT 165.0 02/05/2019   LDLCALC 125 (H) 03/06/2023   ALT 17 03/06/2023   ALT 17 03/06/2023   AST 13 03/06/2023   AST 13 03/06/2023   NA 139 03/06/2023   K 3.9 03/06/2023   CL 105 03/06/2023   CREATININE 0.68 03/06/2023   BUN 13 03/06/2023   CO2 26 03/06/2023   TSH 4.78 03/06/2023   PSA 2.01 03/06/2023    MM 3D SCREENING MAMMOGRAM UNILATERAL RIGHT BREAST Result Date: 05/03/2023 CLINICAL DATA:  Screening. Personal history of malignant LEFT mastectomy. EXAM: DIGITAL SCREENING UNILATERAL RIGHT MAMMOGRAM WITH CAD AND TOMOSYNTHESIS TECHNIQUE: Right screening digital craniocaudal and mediolateral oblique mammograms were obtained. Right screening digital breast tomosynthesis was performed. The images were evaluated with computer-aided detection. COMPARISON:  Previous exam(s). ACR Breast Density Category a: The breasts are almost entirely fatty. FINDINGS: The patient has had a left mastectomy. There are no findings suspicious for malignancy in the RIGHT breast. IMPRESSION:  No mammographic evidence of malignancy. A result letter of this screening mammogram will be mailed directly to the patient. RECOMMENDATION: Screening mammogram in one year.  (Code:SM-R-66M) BI-RADS CATEGORY  1: Negative. Electronically Signed   By: Debby Satterfield M.D.   On: 05/03/2023 11:30    Assessment & Plan:   Problem List Items Addressed This Visit     Coronary atherosclerosis   On ASA Start fish oil  Pt  declined statins EKG - S brady      Relevant Orders   CBC with Differential/Platelet   Comprehensive metabolic panel with GFR   Elevated BP without diagnosis of hypertension   check BP at home      Palpitations   Well adult exam - Primary   We discussed age appropriate health related issues, including available/recomended screening tests and vaccinations. Labs were ordered to be later reviewed . All questions were answered. We discussed one or more of the following - seat belt use, use of sunscreen/sun exposure exercise, fall risk reduction, second hand smoke exposure, firearm use and storage, seat belt use, a need for adhering to healthy diet and exercise. Labs were ordered.  All questions were answered. Cologuard 2019, 2023, 2025 - refused Declined Shingrix , other shots Coronary calcium CT info given      Relevant Orders   TSH   Urinalysis   CBC with Differential/Platelet   Lipid panel   PSA   Comprehensive metabolic panel with GFR   Sedimentation rate   Other Visit Diagnoses       Bladder neck obstruction       Relevant Orders   TSH   PSA         No orders of the defined types were placed in this encounter.     Follow-up: Return in about 6 months (around 09/03/2024) for a follow-up visit.  Marolyn Noel, MD

## 2024-03-06 NOTE — Progress Notes (Addendum)
 Subjective:   Danny Gutierrez is a 78 y.o. who presents for a Medicare Wellness preventive visit.  As a reminder, Annual Wellness Visits don't include a physical exam, and some assessments may be limited, especially if this visit is performed virtually. We may recommend an in-person follow-up visit with your provider if needed.  Visit Complete: In person  Persons Participating in Visit: Patient.  AWV Questionnaire: No: Patient Medicare AWV questionnaire was not completed prior to this visit.  Cardiac Risk Factors include: advanced age (>51men, >43 women);male gender     Objective:    Today's Vitals   03/06/24 1206  BP: 122/88  Pulse: 74  SpO2: 97%  Weight: 159 lb (72.1 kg)  Height: 5' 6 (1.676 m)   Body mass index is 25.66 kg/m.     03/06/2024   12:06 PM 05/07/2020    8:13 AM 04/02/2020   11:46 AM 03/27/2020    2:18 PM 03/18/2020    8:08 PM  Advanced Directives  Does Patient Have a Medical Advance Directive? No Yes No No No  Type of Advance Directive  Living will     Does patient want to make changes to medical advance directive?  No - Patient declined     Would patient like information on creating a medical advance directive? No - Patient declined  No - Patient declined No - Patient declined No - Patient declined    Current Medications (verified) Outpatient Encounter Medications as of 03/06/2024  Medication Sig   acetaminophen  (TYLENOL ) 500 MG tablet Take 500 mg by mouth every 6 (six) hours as needed.   Ascorbic Acid 500 MG CHEW Chew by mouth.   aspirin EC 81 MG tablet Take 81 mg by mouth daily. Swallow whole.   Cholecalciferol 1000 UNITS tablet Take 1,000 Units by mouth daily.   diphenhydrAMINE  (BENADRYL ) 12.5 MG/5ML elixir Take 2.5 mLs (6.25 mg total) by mouth every 6 (six) hours as needed.   dorzolamide-timolol (COSOPT) 22.3-6.8 MG/ML ophthalmic solution INT 1 GTT IN OU BID   LUMIGAN 0.01 % SOLN 1 drop at bedtime. 1 drop in each eye at bedtime    Melatonin 10 MG TABS Take by mouth.   Multiple Minerals-Vitamins (CALCIUM-MAGNESIUM-ZINC-D3 PO) Take by mouth.   Multiple Vitamins-Minerals (CENTRUM SILVER ADULT 50+ PO) Take by mouth.   Multiple Vitamins-Minerals (OCUVITE EYE HEALTH FORMULA PO) Take by mouth daily.   Multiple Vitamins-Minerals (ONE-A-DAY PROACTIVE 65+ PO) Take by mouth.   Omega-3 Fatty Acids (FISH OIL ) 1000 MG CAPS Take 2 capsules (2,000 mg total) by mouth daily.   OVER THE COUNTER MEDICATION Focus Factor   Pollen Extracts (PROSTAT PO) Take by mouth. prostagenix   SUPER B COMPLEX/C PO Take by mouth.   tamoxifen  (NOLVADEX ) 20 MG tablet Take 1 tablet (20 mg total) by mouth daily.   No facility-administered encounter medications on file as of 03/06/2024.    Allergies (verified) Sulfa antibiotics   History: Past Medical History:  Diagnosis Date   Breast cancer (HCC)    Family history of breast cancer    Past Surgical History:  Procedure Laterality Date   ADENOIDECTOMY  1957   ADENOIDECTOMY     childhood   MASTECTOMY W/ SENTINEL NODE BIOPSY Left 04/02/2020   Procedure: LEFT MASTECTOMY WITH LEFT TARGETED RADIOACTIVE SEED  LYMPH NODE BIOPSY AND LEFT SENTINEL LYMPH NODE MAPPING;  Surgeon: Vanderbilt Ned, MD;  Location: [facility];  Service: General;  Laterality: Left;   Family History  Problem Relation Age of Onset  Breast cancer Mother 31   Heart disease Father 27       ?CAD   Social History   Socioeconomic History   Marital status: Legally Separated    Spouse name: Not on file   Number of children: Not on file   Years of education: Not on file   Highest education level: Not on file  Occupational History   Not on file  Tobacco Use   Smoking status: Never   Smokeless tobacco: Never  Substance and Sexual Activity   Alcohol use: No   Drug use: No   Sexual activity: Not Currently  Other Topics Concern   Not on file  Social History Narrative   Not on file   Social Drivers of Health    Financial Resource Strain: Low Risk  (03/06/2024)   Overall Financial Resource Strain (CARDIA)    Difficulty of Paying Living Expenses: Not hard at all  Food Insecurity: No Food Insecurity (03/06/2024)   Hunger Vital Sign    Worried About Running Out of Food in the Last Year: Never true    Ran Out of Food in the Last Year: Never true  Transportation Needs: No Transportation Needs (03/06/2024)   PRAPARE - Administrator, Civil Service (Medical): No    Lack of Transportation (Non-Medical): No  Physical Activity: Sufficiently Active (03/06/2024)   Exercise Vital Sign    Days of Exercise per Week: 7 days    Minutes of Exercise per Session: 60 min  Stress: No Stress Concern Present (03/06/2024)   [employer] of Occupational Health - Occupational Stress Questionnaire    Feeling of Stress: Not at all  Social Connections: Moderately Integrated (03/06/2024)   Social Connection and Isolation Panel    Frequency of Communication with Friends and Family: More than three times a week    Frequency of Social Gatherings with Friends and Family: More than three times a week    Attends Religious Services: More than 4 times per year    Active Member of [employer] or Organizations: Yes    Attends Engineer, structural: More than 4 times per year    Marital Status: Separated    Tobacco Counseling Counseling given: No    Clinical Intake:  Pre-visit preparation completed: Yes  Pain : No/denies pain     BMI - recorded: 25.66 Nutritional Status: BMI 25 -29 Overweight Nutritional Risks: None Diabetes: No  No results found for: HGBA1C   How often do you need to have someone help you when you read instructions, pamphlets, or other written materials from your doctor or pharmacy?: 1 - Never  Interpreter Needed?: No  Information entered by :: Verdie Saba, CMA   Activities of Daily Living     03/06/2024   12:08 PM  In your present state of health, do you have any  difficulty performing the following activities:  Hearing? 0  Vision? 0  Difficulty concentrating or making decisions? 0  Walking or climbing stairs? 0  Dressing or bathing? 0  Doing errands, shopping? 0  Preparing Food and eating ? N  Using the Toilet? N  In the past six months, have you accidently leaked urine? N  Do you have problems with loss of bowel control? N  Managing your Medications? N  Managing your Finances? N  Housekeeping or managing your Housekeeping? N    Patient Care Team: Plotnikov, Karlynn GAILS, MD as PCP - General (Internal Medicine) Vanderbilt Ned, MD as Consulting Physician (General Surgery) Gudena, Vinay,  MD as Consulting Physician (Hematology and Oncology) Dewey Rush, MD as Consulting Physician (Radiation Oncology)  I have updated your Care Teams any recent Medical Services you may have received from other providers in the past year.     Assessment:   This is a routine wellness examination for Tor.  Hearing/Vision screen Hearing Screening - Comments:: Denies hearing difficulties   Vision Screening - Comments:: Wears rx glasses - up to date with routine eye exams with an Ophthalmologist in [place], [place]   Goals Addressed               This Visit's Progress     Patient Stated (pt-stated)        Patient stated he plans to keep walking and stay active       Depression Screen     03/06/2024   12:09 PM 03/06/2024   10:19 AM 03/06/2023    9:38 AM 03/02/2022    2:12 PM 02/10/2021   10:06 AM 02/06/2020   10:13 AM 01/25/2018   10:43 AM  PHQ 2/9 Scores  PHQ - 2 Score 0 0 0 0 0 0 0  PHQ- 9 Score 0   0 0      Fall Risk     03/06/2024   12:08 PM 03/06/2024   10:19 AM 03/06/2023    9:38 AM 03/02/2022    2:12 PM 02/10/2021   10:05 AM  Fall Risk   Falls in the past year? 0 0 0 0 0  Number falls in past yr: 0 0 0 0 0  Injury with Fall? 0 0 0 0 0  Risk for fall due to : No Fall Risks No Fall Risks No Fall Risks No Fall Risks No Fall Risks  Follow up  Falls evaluation completed;Falls prevention discussed Falls evaluation completed Falls evaluation completed      MEDICARE RISK AT HOME:  Medicare Risk at Home Any stairs in or around the home?: No If so, are there any without handrails?: No Home free of loose throw rugs in walkways, pet beds, electrical cords, etc?: Yes Adequate lighting in your home to reduce risk of falls?: Yes Life alert?: No Use of a cane, walker or w/c?: No Grab bars in the bathroom?: No Shower chair or bench in shower?: No Elevated toilet seat or a handicapped toilet?: No  TIMED UP AND GO:  Was the test performed?  No  Cognitive Function: 6CIT completed        03/06/2024   12:15 PM  6CIT Screen  What Year? 0 points  What month? 0 points  What time? 0 points  Count back from 20 0 points  Months in reverse 0 points  Repeat phrase 0 points  Total Score 0 points    Immunizations Immunization History  Administered Date(s) Administered   Fluad Quad(high Dose 65+) 04/19/2019   INFLUENZA, HIGH DOSE SEASONAL PF 04/09/2016   Moderna Sars-Covid-2 Vaccination 10/24/2019, 11/21/2019   Pneumococcal Conjugate-13 01/24/2017   Pneumococcal Polysaccharide-23 01/13/2016    Screening Tests Health Maintenance  Topic Date Due   Hepatitis C Screening  Never done   DTaP/Tdap/Td (1 - Tdap) Never done   COVID-19 Vaccine (3 - Moderna risk series) 12/19/2019   Medicare Annual Wellness (AWV)  03/06/2025   Pneumococcal Vaccine: 50+ Years  Completed   HPV VACCINES  Aged Out   Meningococcal B Vaccine  Aged Out   Influenza Vaccine  Discontinued   Fecal DNA (Cologuard)  Discontinued   Zoster Vaccines- Shingrix   Discontinued    Health Maintenance Items Addressed:  Labs Ordered: Hepatitis C Screening   Additional Screening:  Vision Screening: Recommended annual ophthalmology exams for early detection of glaucoma and other disorders of the eye. Is the patient up to date with their annual eye exam?  No  Who is the  provider or what is the name of the office in which the patient attends annual eye exams?   Dental Screening: Recommended annual dental exams for proper oral hygiene  Community Resource Referral / Chronic Care Management: CRR required this visit?  No   CCM required this visit?  No   Plan:    I have personally reviewed and noted the following in the patient's chart:   Medical and social history Use of alcohol, tobacco or illicit drugs  Current medications and supplements including opioid prescriptions. Patient is not currently taking opioid prescriptions. Functional ability and status Nutritional status Physical activity Advanced directives List of other physicians Hospitalizations, surgeries, and ER visits in previous 12 months Vitals Screenings to include cognitive, depression, and falls Referrals and appointments  In addition, I have reviewed and discussed with patient certain preventive protocols, quality metrics, and best practice recommendations. A written personalized care plan for preventive services as well as general preventive health recommendations were provided to patient.   Verdie CHRISTELLA Saba, CMA   03/06/2024   After Visit Summary: (In Person-Declined) Patient declined AVS at this time.  Notes: Nothing significant to report at this time.  Medical screening examination/treatment/procedure(s) were performed by non-physician practitioner and as supervising physician I was immediately available for consultation/collaboration.  I agree with above. Karlynn Noel, MD

## 2024-03-06 NOTE — Assessment & Plan Note (Addendum)
 We discussed age appropriate health related issues, including available/recomended screening tests and vaccinations. Labs were ordered to be later reviewed . All questions were answered. We discussed one or more of the following - seat belt use, use of sunscreen/sun exposure exercise, fall risk reduction, second hand smoke exposure, firearm use and storage, seat belt use, a need for adhering to healthy diet and exercise. Labs were ordered.  All questions were answered. Cologuard 2019, 2023, 2025 - refused Declined Shingrix , other shots Coronary calcium CT info given

## 2024-03-06 NOTE — Assessment & Plan Note (Signed)
check BP at home 

## 2024-03-06 NOTE — Patient Instructions (Signed)

## 2024-03-06 NOTE — Patient Instructions (Addendum)
 Danny Gutierrez,  Thank you for taking the time for your Medicare Wellness Visit. I appreciate your continued commitment to your health goals. Please review the care plan we discussed, and feel free to reach out if I can assist you further.  Medicare recommends these wellness visits once per year to help you and your care team stay ahead of potential health issues. These visits are designed to focus on prevention, allowing your provider to concentrate on managing your acute and chronic conditions during your regular appointments.  Please note that Annual Wellness Visits do not include a physical exam. Some assessments may be limited, especially if the visit was conducted virtually. If needed, we may recommend a separate in-person follow-up with your provider.  Ongoing Care Seeing your primary care provider every 3 to 6 months helps us  monitor your health and provide consistent, personalized care.   Referrals If a referral was made during today's visit and you haven't received any updates within two weeks, please contact the referred provider directly to check on the status.  Recommended Screenings:  Health Maintenance  Topic Date Due   Hepatitis C Screening  Never done   DTaP/Tdap/Td vaccine (1 - Tdap) Never done   COVID-19 Vaccine (3 - Moderna risk series) 12/19/2019   Medicare Annual Wellness Visit  03/06/2025   Pneumococcal Vaccine for age over 92  Completed   HPV Vaccine  Aged Out   Meningitis B Vaccine  Aged Out   Flu Shot  Discontinued   Cologuard (Stool DNA test)  Discontinued   Zoster (Shingles) Vaccine  Discontinued       05/07/2020    8:13 AM  Advanced Directives  Does Patient Have a Medical Advance Directive? Yes  Type of Advance Directive Living will  Does patient want to make changes to medical advance directive? No - Patient declined   Advance Care Planning is important because it: Ensures you receive medical care that aligns with your values, goals, and  preferences. Provides guidance to your family and loved ones, reducing the emotional burden of decision-making during critical moments.  Vision: Annual vision screenings are recommended for early detection of glaucoma, cataracts, and diabetic retinopathy. These exams can also reveal signs of chronic conditions such as diabetes and high blood pressure.  Dental: Annual dental screenings help detect early signs of oral cancer, gum disease, and other conditions linked to overall health, including heart disease and diabetes.

## 2024-03-07 LAB — HEPATITIS C ANTIBODY: Hepatitis C Ab: NONREACTIVE

## 2024-03-11 ENCOUNTER — Ambulatory Visit: Payer: Self-pay | Admitting: Internal Medicine

## 2024-03-11 ENCOUNTER — Other Ambulatory Visit: Payer: Self-pay | Admitting: Internal Medicine

## 2024-03-11 DIAGNOSIS — D696 Thrombocytopenia, unspecified: Secondary | ICD-10-CM

## 2024-03-11 DIAGNOSIS — R7989 Other specified abnormal findings of blood chemistry: Secondary | ICD-10-CM

## 2024-03-20 ENCOUNTER — Ambulatory Visit
Admission: RE | Admit: 2024-03-20 | Discharge: 2024-03-20 | Disposition: A | Discharge status: Home / Self Care | Source: Ambulatory Visit | Attending: Internal Medicine | Admitting: Internal Medicine

## 2024-03-20 DIAGNOSIS — R7989 Other specified abnormal findings of blood chemistry: Secondary | ICD-10-CM

## 2024-03-20 DIAGNOSIS — K8020 Calculus of gallbladder without cholecystitis without obstruction: Secondary | ICD-10-CM | POA: Diagnosis not present

## 2024-03-23 ENCOUNTER — Ambulatory Visit: Payer: Self-pay | Admitting: Internal Medicine

## 2024-03-23 DIAGNOSIS — K8020 Calculus of gallbladder without cholecystitis without obstruction: Secondary | ICD-10-CM | POA: Insufficient documentation

## 2024-03-23 DIAGNOSIS — Z170 Estrogen receptor positive status [ER+]: Secondary | ICD-10-CM

## 2024-03-26 NOTE — Addendum Note (Signed)
 Addended by: BEVELY CURTISTINE PARAS on: 03/26/2024 11:26 AM   Modules accepted: Orders

## 2024-04-08 ENCOUNTER — Other Ambulatory Visit: Payer: Self-pay | Admitting: Hematology and Oncology

## 2024-04-08 DIAGNOSIS — Z1231 Encounter for screening mammogram for malignant neoplasm of breast: Secondary | ICD-10-CM

## 2024-04-18 ENCOUNTER — Inpatient Hospital Stay: Payer: PPO | Attending: Hematology and Oncology | Admitting: Hematology and Oncology

## 2024-04-18 ENCOUNTER — Inpatient Hospital Stay

## 2024-04-18 VITALS — BP 140/84 | HR 79 | Temp 98.4°F | Resp 18 | Ht 66.0 in | Wt 160.4 lb

## 2024-04-18 DIAGNOSIS — Z923 Personal history of irradiation: Secondary | ICD-10-CM | POA: Insufficient documentation

## 2024-04-18 DIAGNOSIS — K8020 Calculus of gallbladder without cholecystitis without obstruction: Secondary | ICD-10-CM | POA: Diagnosis not present

## 2024-04-18 DIAGNOSIS — Z1721 Progesterone receptor positive status: Secondary | ICD-10-CM | POA: Diagnosis not present

## 2024-04-18 DIAGNOSIS — Z170 Estrogen receptor positive status [ER+]: Secondary | ICD-10-CM | POA: Diagnosis not present

## 2024-04-18 DIAGNOSIS — Z79810 Long term (current) use of selective estrogen receptor modulators (SERMs): Secondary | ICD-10-CM | POA: Diagnosis not present

## 2024-04-18 DIAGNOSIS — D72819 Decreased white blood cell count, unspecified: Secondary | ICD-10-CM | POA: Diagnosis not present

## 2024-04-18 DIAGNOSIS — Z1732 Human epidermal growth factor receptor 2 negative status: Secondary | ICD-10-CM | POA: Insufficient documentation

## 2024-04-18 DIAGNOSIS — C50222 Malignant neoplasm of upper-inner quadrant of left male breast: Secondary | ICD-10-CM | POA: Insufficient documentation

## 2024-04-18 DIAGNOSIS — R5383 Other fatigue: Secondary | ICD-10-CM | POA: Diagnosis not present

## 2024-04-18 DIAGNOSIS — Z9012 Acquired absence of left breast and nipple: Secondary | ICD-10-CM | POA: Diagnosis not present

## 2024-04-18 DIAGNOSIS — R634 Abnormal weight loss: Secondary | ICD-10-CM | POA: Diagnosis not present

## 2024-04-18 LAB — CBC WITH DIFFERENTIAL (CANCER CENTER ONLY)
Abs Immature Granulocytes: 0.01 K/uL (ref 0.00–0.07)
Basophils Absolute: 0 K/uL (ref 0.0–0.1)
Basophils Relative: 1 %
Eosinophils Absolute: 0.3 K/uL (ref 0.0–0.5)
Eosinophils Relative: 3 %
HCT: 42.3 % (ref 39.0–52.0)
Hemoglobin: 14.1 g/dL (ref 13.0–17.0)
Immature Granulocytes: 0 %
Lymphocytes Relative: 17 %
Lymphs Abs: 1.3 K/uL (ref 0.7–4.0)
MCH: 27.3 pg (ref 26.0–34.0)
MCHC: 33.3 g/dL (ref 30.0–36.0)
MCV: 81.8 fL (ref 80.0–100.0)
Monocytes Absolute: 0.6 K/uL (ref 0.1–1.0)
Monocytes Relative: 8 %
Neutro Abs: 5.7 K/uL (ref 1.7–7.7)
Neutrophils Relative %: 71 %
Platelet Count: 190 K/uL (ref 150–400)
RBC: 5.17 MIL/uL (ref 4.22–5.81)
RDW: 16.6 % — ABNORMAL HIGH (ref 11.5–15.5)
WBC Count: 7.9 K/uL (ref 4.0–10.5)
nRBC: 0 % (ref 0.0–0.2)

## 2024-04-18 MED ORDER — TAMOXIFEN CITRATE 10 MG PO TABS: 10.0000 mg | ORAL_TABLET | Freq: Every day | ORAL | 3 refills | Status: DC

## 2024-04-18 NOTE — Assessment & Plan Note (Signed)
 04/02/2020:Left mastectomy ([facility]): invasive and in situ ductal carcinoma, 2.8cm, clear margins, and 1/2 left axillary lymph nodes positive for carcinoma with extranodal extension, 1.1cm. grade 2, HER-2 equivocal by IHC (2+) FISH negative, ER/PR+ >95%, Ki67 25%.   MammaPrint: Luminal type A, low risk (no benefit from chemotherapy)   Treatment plan: 1. adjuvant radiation started 05/19/2020 (will complete 07/06/2020) 2. Adjuvant antiestrogen therapy with tamoxifen  x10 years started 08/04/2020   Tamoxifen  toxicities: Other than mild fatigue he is tolerating the treatment fairly well. He had a brief episode of vertigo and was thinking it might be tamoxifen  related but it got better and he has not had any further issues with vertigo anymore.   Breast cancer surveillance: 1.  Breast exam 04/18/2024: Benign 2. right breast mammogram scheduled for 05/01/2024    CT chest 03/29/2021: Previously noted pulmonary nodules are stable considered benign, hepatic steatosis, postsurgical changes.    Return to clinic in 1 year for follow-up

## 2024-04-18 NOTE — Progress Notes (Signed)
 Patient Care Team: Plotnikov, Karlynn GAILS, MD as PCP - General (Internal Medicine) Vanderbilt Ned, MD as Consulting Physician (General Surgery) Odean Potts, MD as Consulting Physician (Hematology and Oncology) Dewey Rush, MD as Consulting Physician (Radiation Oncology)  DIAGNOSIS:  Encounter Diagnosis  Name Primary?   Malignant neoplasm of upper-inner quadrant of left breast in male, estrogen receptor positive (HCC) Yes    SUMMARY OF ONCOLOGIC HISTORY: Oncology History  Malignant neoplasm of upper-inner quadrant of left breast in male, estrogen receptor positive (HCC)  03/12/2019 Initial Diagnosis   Patient palpated a left breast mass. Diagnostic mammogram and US  showed no right breast malignancy and a 3.0cm mass in the upper inner quadrant of the left breast with a promnent left axillary lymph node. Biopsy showed invasive mammary carcinoma in the breast and axilla, grade 2, HER-2 equivocal by IHC (2+), ER/PR+ >95%, Ki67 25%.   07/2019 - 07/2029 Anti-estrogen oral therapy   Tamoxifen    04/02/2020 Surgery   Left mastectomy ([facility]) [phone]): invasive and in situ ductal carcinoma, 2.8cm, clear margins, and 1/2 left axillary lymph nodes positive for carcinoma, 1.1cm.    04/02/2020 Cancer Staging   Staging form: Breast, AJCC 8th Edition - Pathologic stage from 04/02/2020: Stage IB (pT2, pN1a, cM0, G2, ER+, PR+, HER2-) Stage prefix: Initial diagnosis Histologic grading system: 3 grade system   04/13/2020 Oncotype testing   Mammaprint: low risk   05/19/2020 - 07/13/2020 Radiation Therapy   The patient initially received a dose of 50.4 Gy in 28 fractions to the left chest wall and supraclavicular region. This was delivered using a 3-D conformal, 4 field technique. The patient then received a boost to the mastectomy scar. This delivered an additional 10 Gy in 5 fractions using an en face electron field. The total dose was 60.4 Gy.     CHIEF COMPLIANT: Surveillance of breast  cancer  HISTORY OF PRESENT ILLNESS:  History of Present Illness Danny Gutierrez is a 77 year old male with breast cancer who presents with concerns about side effects from tamoxifen . He was referred by Dr. Susie for evaluation of gallstones and potential tamoxifen  side effects.  He experiences side effects from tamoxifen , including a significant drop in red blood cell count, with an index of 3. He recalls a similar issue in 2025 and references a study on tamoxifen  side effects. He reports fatigue and unintentional weight loss, which he associates with his red blood cell count. He has been on tamoxifen  for three and a half years and is inquiring about alternative medications due to these side effects. An incidental finding of gallstones was discovered during a liver ultrasound, but he has not experienced any gallstone attacks. He also mentions vision problems related to glaucoma and cataracts, which he believes may be linked to tamoxifen  use.     ALLERGIES:  is allergic to sulfa antibiotics.  MEDICATIONS:  Current Outpatient Medications  Medication Sig Dispense Refill   acetaminophen  (TYLENOL ) 500 MG tablet Take 500 mg by mouth every 6 (six) hours as needed.     Ascorbic Acid 500 MG CHEW Chew by mouth.     aspirin EC 81 MG tablet Take 81 mg by mouth daily. Swallow whole.     Cholecalciferol 1000 UNITS tablet Take 1,000 Units by mouth daily.     diphenhydrAMINE  (BENADRYL ) 12.5 MG/5ML elixir Take 2.5 mLs (6.25 mg total) by mouth every 6 (six) hours as needed. 120 mL 0   dorzolamide-timolol (COSOPT) 22.3-6.8 MG/ML ophthalmic solution INT 1 GTT IN OU BID  6   LUMIGAN 0.01 % SOLN 1 drop at bedtime. 1 drop in each eye at bedtime     Melatonin 10 MG TABS Take by mouth.     Multiple Minerals-Vitamins (CALCIUM-MAGNESIUM-ZINC-D3 PO) Take by mouth.     Multiple Vitamins-Minerals (CENTRUM SILVER ADULT 50+ PO) Take by mouth.     Multiple Vitamins-Minerals (OCUVITE EYE HEALTH FORMULA PO) Take  by mouth daily.     Multiple Vitamins-Minerals (ONE-A-DAY PROACTIVE 65+ PO) Take by mouth.     Omega-3 Fatty Acids (FISH OIL ) 1000 MG CAPS Take 2 capsules (2,000 mg total) by mouth daily. 100 capsule 3   OVER THE COUNTER MEDICATION Focus Factor     Pollen Extracts (PROSTAT PO) Take by mouth. prostagenix     SUPER B COMPLEX/C PO Take by mouth.     tamoxifen  (NOLVADEX ) 20 MG tablet TAKE 1 TABLET(20 MG) BY MOUTH DAILY 90 tablet 3   No current facility-administered medications for this visit.    PHYSICAL EXAMINATION: ECOG PERFORMANCE STATUS: 1 - Symptomatic but completely ambulatory  Vitals:   04/18/24 1134  BP: (!) 140/84  Pulse: 79  Resp: 18  Temp: 98.4 F (36.9 C)  SpO2: 98%   Filed Weights   04/18/24 1134  Weight: 160 lb 6.4 oz (72.8 kg)   Breast exam no palpable lumps marks of bilateral breasts or axilla  LABORATORY DATA:  I have reviewed the data as listed    Latest Ref Rng & Units 03/06/2024   11:51 AM 03/06/2023   10:53 AM 03/02/2022    3:31 PM  CMP  Glucose 70 - 99 mg/dL 890  884  92   BUN 6 - 23 mg/dL 14  13  16    Creatinine 0.40 - 1.50 mg/dL 9.29  9.31  9.31   Sodium 135 - 145 mEq/L 139  139  140   Potassium 3.5 - 5.1 mEq/L 3.7  3.9  3.9   Chloride 96 - 112 mEq/L 103  105  107   CO2 19 - 32 mEq/L 24  26  26    Calcium 8.4 - 10.5 mg/dL 8.8  8.8  8.7   Total Protein 6.0 - 8.3 g/dL 6.8  6.6    6.6  6.5    6.5   Total Bilirubin 0.2 - 1.2 mg/dL 0.4  0.4    0.4  0.4    0.4   Alkaline Phos 39 - 117 U/L 37  42    42  47    47   AST 0 - 37 U/L 38  13    13  12    12    ALT 0 - 53 U/L 56  17    17  15    15      Lab Results  Component Value Date   WBC 3.8 (L) 03/06/2024   HGB 14.1 03/06/2024   HCT 42.4 03/06/2024   MCV 80.4 03/06/2024   PLT 148.0 (L) 03/06/2024   NEUTROABS 2.4 03/06/2024    ASSESSMENT & PLAN:  Malignant neoplasm of upper-inner quadrant of left breast in male, estrogen receptor positive (HCC) 04/02/2020:Left mastectomy ([facility]):  invasive and in situ ductal carcinoma, 2.8cm, clear margins, and 1/2 left axillary lymph nodes positive for carcinoma with extranodal extension, 1.1cm. grade 2, HER-2 equivocal by IHC (2+) FISH negative, ER/PR+ >95%, Ki67 25%.   MammaPrint: Luminal type A, low risk (no benefit from chemotherapy)   Treatment plan: 1. adjuvant radiation started 05/19/2020 (will complete 07/06/2020) 2. Adjuvant antiestrogen therapy with  tamoxifen  x10 years started 08/04/2020   Tamoxifen  toxicities: Patient attributes's gallstones to tamoxifen .  He also attributes his blood changes to tamoxifen .  I reassured him that tamoxifen  is not the reason for gallstones or the blood work changes.  He had a slight leukopenia and was very concerned about it.  We repeated it today and there was no evidence of leukopenia.   Breast cancer surveillance: 1.  Breast exam 04/18/2024: Benign 2. right breast mammogram scheduled for 05/01/2024    CT chest 03/29/2021: Previously noted pulmonary nodules are stable considered benign, hepatic steatosis, postsurgical changes.    Return to clinic in 1 year for follow-up ------------------------------------- Assessment and Plan Assessment & Plan Estrogen receptor positive left male breast cancer, status post mastectomy, on tamoxifen  therapy Experiencing side effects potentially related to tamoxifen , including fatigue and vision issues. Lower doses of tamoxifen  have shown to be effective with fewer side effects. - Prescribe tamoxifen  10 mg daily instead of 20 mg to reduce side effects. - Continue tamoxifen  therapy for another year and nine months.  Fatigue and unintentional weight loss Fatigue potentially related to tamoxifen  or other causes. Unintentional weight loss reported. Red blood cell levels are normal. - Reduce tamoxifen  dosage to 10 mg to see if fatigue improves. - Monitor weight and energy levels.  Leukopenia Leukopenia with decreased white blood cell count. Cause not  definitively linked to tamoxifen . Current level not concerning. - Perform CBC today to reassess white blood cell levels. - Review lab results in half an hour; contact if there are any concerns.  Cholelithiasis without cholecystitis or obstruction Gallstones found incidentally on ultrasound. Patient has experienced symptoms. Not related to tamoxifen  use. - No immediate intervention required for gallstones.      No orders of the defined types were placed in this encounter.  The patient has a good understanding of the overall plan. he agrees with it. he will call with any problems that may develop before the next visit here.  I personally spent a total of 30 minutes in the care of the patient today including preparing to see the patient, getting/reviewing separately obtained history, performing a medically appropriate exam/evaluation, counseling and educating, placing orders, referring and communicating with other health care professionals, documenting clinical information in the EHR, independently interpreting results, communicating results, and coordinating care.   Viinay K Leighton Brickley, MD 04/18/24

## 2024-04-19 ENCOUNTER — Encounter: Payer: Self-pay | Admitting: Hematology and Oncology

## 2024-04-22 ENCOUNTER — Other Ambulatory Visit: Payer: Self-pay

## 2024-04-22 MED ORDER — TAMOXIFEN CITRATE 10 MG PO TABS: 10.0000 mg | ORAL_TABLET | Freq: Two times a day (BID) | ORAL | 3 refills | Status: DC

## 2024-05-01 ENCOUNTER — Ambulatory Visit
Admission: RE | Admit: 2024-05-01 | Discharge: 2024-05-01 | Disposition: A | Discharge status: Home / Self Care | Source: Ambulatory Visit | Attending: Hematology and Oncology | Admitting: Hematology and Oncology

## 2024-05-01 DIAGNOSIS — Z1231 Encounter for screening mammogram for malignant neoplasm of breast: Secondary | ICD-10-CM | POA: Diagnosis not present

## 2024-05-01 HISTORY — DX: Personal history of irradiation: Z92.3

## 2024-05-23 DIAGNOSIS — H02006 Unspecified entropion of left eye, unspecified eyelid: Secondary | ICD-10-CM | POA: Diagnosis not present

## 2024-05-23 DIAGNOSIS — H524 Presbyopia: Secondary | ICD-10-CM | POA: Diagnosis not present

## 2024-05-23 DIAGNOSIS — H52203 Unspecified astigmatism, bilateral: Secondary | ICD-10-CM | POA: Diagnosis not present

## 2024-05-23 DIAGNOSIS — H268 Other specified cataract: Secondary | ICD-10-CM | POA: Diagnosis not present

## 2024-05-23 DIAGNOSIS — H401432 Capsular glaucoma with pseudoexfoliation of lens, bilateral, moderate stage: Secondary | ICD-10-CM | POA: Diagnosis not present

## 2024-05-23 DIAGNOSIS — H5203 Hypermetropia, bilateral: Secondary | ICD-10-CM | POA: Diagnosis not present

## 2024-07-07 ENCOUNTER — Encounter: Payer: Self-pay | Admitting: Hematology and Oncology

## 2024-07-08 ENCOUNTER — Other Ambulatory Visit: Payer: Self-pay | Admitting: *Deleted

## 2024-07-08 MED ORDER — TAMOXIFEN CITRATE 20 MG PO TABS: 20.0000 mg | ORAL_TABLET | Freq: Every day | ORAL | 2 refills | Status: AC

## 2024-07-08 NOTE — Telephone Encounter (Signed)
 Received message from pt stating he is curretnly still taking Tamoxifen  20 mg p.o daily and requesting refill.   Prescription sent to pharmacy per pt request.

## 2025-03-10 ENCOUNTER — Ambulatory Visit

## 2025-03-10 ENCOUNTER — Encounter: Admitting: Internal Medicine

## 2025-03-13 ENCOUNTER — Encounter: Admitting: Internal Medicine

## 2025-03-13 ENCOUNTER — Ambulatory Visit

## 2025-04-17 ENCOUNTER — Inpatient Hospital Stay: Admitting: Hematology and Oncology
